# Patient Record
Sex: Female | Born: 1970 | Race: Black or African American | Hispanic: No | Marital: Married | Smoking: Never smoker
Health system: Southern US, Community
[De-identification: ages and names within clinical notes are randomized; demographics above are authoritative.]

## PROBLEM LIST (undated history)

## (undated) DIAGNOSIS — H9311 Tinnitus, right ear: Secondary | ICD-10-CM

## (undated) HISTORY — DX: Tinnitus, right ear: H93.11

## (undated) HISTORY — PX: BREAST REDUCTION SURGERY: SHX8

---

## 1997-09-14 ENCOUNTER — Emergency Department (HOSPITAL_COMMUNITY): Admission: EM | Admit: 1997-09-14 | Discharge: 1997-09-14 | Payer: Self-pay | Admitting: Emergency Medicine

## 1998-01-26 ENCOUNTER — Encounter: Admission: RE | Admit: 1998-01-26 | Discharge: 1998-01-26 | Payer: Self-pay | Admitting: *Deleted

## 1998-07-12 ENCOUNTER — Encounter: Payer: Self-pay | Admitting: Emergency Medicine

## 1998-07-12 ENCOUNTER — Emergency Department (HOSPITAL_COMMUNITY): Admission: EM | Admit: 1998-07-12 | Discharge: 1998-07-12 | Payer: Self-pay | Admitting: Emergency Medicine

## 1998-07-30 ENCOUNTER — Encounter: Payer: Self-pay | Admitting: Emergency Medicine

## 1998-07-30 ENCOUNTER — Emergency Department (HOSPITAL_COMMUNITY): Admission: EM | Admit: 1998-07-30 | Discharge: 1998-07-30 | Payer: Self-pay | Admitting: Emergency Medicine

## 1998-09-04 ENCOUNTER — Encounter: Admission: RE | Admit: 1998-09-04 | Discharge: 1998-09-04 | Payer: Self-pay | Admitting: Family Medicine

## 1999-08-07 ENCOUNTER — Inpatient Hospital Stay (HOSPITAL_COMMUNITY): Admission: AD | Admit: 1999-08-07 | Discharge: 1999-08-07 | Payer: Self-pay | Admitting: *Deleted

## 2000-04-15 ENCOUNTER — Encounter: Payer: Self-pay | Admitting: *Deleted

## 2000-04-15 ENCOUNTER — Ambulatory Visit (HOSPITAL_COMMUNITY): Admission: RE | Admit: 2000-04-15 | Discharge: 2000-04-15 | Payer: Self-pay | Admitting: *Deleted

## 2000-04-16 ENCOUNTER — Encounter (INDEPENDENT_AMBULATORY_CARE_PROVIDER_SITE_OTHER): Payer: Self-pay | Admitting: Specialist

## 2000-04-16 ENCOUNTER — Ambulatory Visit (HOSPITAL_COMMUNITY): Admission: RE | Admit: 2000-04-16 | Discharge: 2000-04-16 | Payer: Self-pay | Admitting: *Deleted

## 2000-12-31 ENCOUNTER — Other Ambulatory Visit: Admission: RE | Admit: 2000-12-31 | Discharge: 2000-12-31 | Payer: Self-pay | Admitting: Obstetrics and Gynecology

## 2001-05-20 ENCOUNTER — Emergency Department (HOSPITAL_COMMUNITY): Admission: EM | Admit: 2001-05-20 | Discharge: 2001-05-20 | Payer: Self-pay

## 2001-11-29 ENCOUNTER — Encounter (INDEPENDENT_AMBULATORY_CARE_PROVIDER_SITE_OTHER): Payer: Self-pay | Admitting: *Deleted

## 2001-11-29 ENCOUNTER — Ambulatory Visit (HOSPITAL_BASED_OUTPATIENT_CLINIC_OR_DEPARTMENT_OTHER): Admission: RE | Admit: 2001-11-29 | Discharge: 2001-11-29 | Payer: Self-pay | Admitting: Specialist

## 2002-01-03 ENCOUNTER — Other Ambulatory Visit: Admission: RE | Admit: 2002-01-03 | Discharge: 2002-01-03 | Payer: Self-pay | Admitting: Obstetrics and Gynecology

## 2002-01-30 ENCOUNTER — Emergency Department (HOSPITAL_COMMUNITY): Admission: EM | Admit: 2002-01-30 | Discharge: 2002-01-30 | Payer: Self-pay | Admitting: Emergency Medicine

## 2002-09-29 ENCOUNTER — Other Ambulatory Visit: Admission: RE | Admit: 2002-09-29 | Discharge: 2002-09-29 | Payer: Self-pay | Admitting: Obstetrics and Gynecology

## 2002-10-26 ENCOUNTER — Emergency Department (HOSPITAL_COMMUNITY): Admission: EM | Admit: 2002-10-26 | Discharge: 2002-10-27 | Payer: Self-pay | Admitting: Emergency Medicine

## 2004-02-24 ENCOUNTER — Emergency Department (HOSPITAL_COMMUNITY): Admission: EM | Admit: 2004-02-24 | Discharge: 2004-02-24 | Payer: Self-pay | Admitting: Emergency Medicine

## 2004-04-07 ENCOUNTER — Inpatient Hospital Stay (HOSPITAL_COMMUNITY): Admission: AD | Admit: 2004-04-07 | Discharge: 2004-04-07 | Payer: Self-pay | Admitting: Obstetrics and Gynecology

## 2004-08-06 ENCOUNTER — Ambulatory Visit: Payer: Self-pay | Admitting: Internal Medicine

## 2004-08-07 ENCOUNTER — Ambulatory Visit (HOSPITAL_COMMUNITY): Admission: RE | Admit: 2004-08-07 | Discharge: 2004-08-07 | Payer: Self-pay | Admitting: Obstetrics and Gynecology

## 2004-08-13 ENCOUNTER — Ambulatory Visit: Payer: Self-pay

## 2004-10-10 ENCOUNTER — Ambulatory Visit: Payer: Self-pay | Admitting: Internal Medicine

## 2004-10-16 ENCOUNTER — Other Ambulatory Visit: Admission: RE | Admit: 2004-10-16 | Discharge: 2004-10-16 | Payer: Self-pay | Admitting: Internal Medicine

## 2004-10-16 ENCOUNTER — Ambulatory Visit: Payer: Self-pay | Admitting: Internal Medicine

## 2005-05-30 ENCOUNTER — Ambulatory Visit: Payer: Self-pay | Admitting: Family Medicine

## 2005-06-03 ENCOUNTER — Ambulatory Visit: Payer: Self-pay | Admitting: Internal Medicine

## 2005-06-13 ENCOUNTER — Ambulatory Visit: Payer: Self-pay | Admitting: Internal Medicine

## 2005-08-26 ENCOUNTER — Ambulatory Visit: Payer: Self-pay | Admitting: Internal Medicine

## 2005-08-29 ENCOUNTER — Emergency Department (HOSPITAL_COMMUNITY): Admission: EM | Admit: 2005-08-29 | Discharge: 2005-08-29 | Payer: Self-pay | Admitting: Emergency Medicine

## 2005-09-26 ENCOUNTER — Inpatient Hospital Stay (HOSPITAL_COMMUNITY): Admission: AD | Admit: 2005-09-26 | Discharge: 2005-09-26 | Payer: Self-pay | Admitting: Obstetrics and Gynecology

## 2005-11-27 ENCOUNTER — Ambulatory Visit: Payer: Self-pay | Admitting: Internal Medicine

## 2005-12-15 ENCOUNTER — Ambulatory Visit: Payer: Self-pay | Admitting: Internal Medicine

## 2006-01-28 ENCOUNTER — Ambulatory Visit: Payer: Self-pay | Admitting: Internal Medicine

## 2006-02-05 ENCOUNTER — Encounter: Admission: RE | Admit: 2006-02-05 | Discharge: 2006-02-05 | Payer: Self-pay | Admitting: Internal Medicine

## 2006-03-27 ENCOUNTER — Ambulatory Visit: Payer: Self-pay | Admitting: Internal Medicine

## 2006-04-01 ENCOUNTER — Encounter: Admission: RE | Admit: 2006-04-01 | Discharge: 2006-04-01 | Payer: Self-pay | Admitting: Internal Medicine

## 2006-05-23 ENCOUNTER — Ambulatory Visit: Payer: Self-pay | Admitting: Internal Medicine

## 2006-06-03 ENCOUNTER — Ambulatory Visit: Payer: Self-pay | Admitting: Family Medicine

## 2006-07-07 ENCOUNTER — Ambulatory Visit: Payer: Self-pay | Admitting: Internal Medicine

## 2006-09-03 ENCOUNTER — Emergency Department (HOSPITAL_COMMUNITY): Admission: EM | Admit: 2006-09-03 | Discharge: 2006-09-04 | Payer: Self-pay | Admitting: *Deleted

## 2006-09-05 ENCOUNTER — Ambulatory Visit: Payer: Self-pay | Admitting: Internal Medicine

## 2007-01-14 ENCOUNTER — Ambulatory Visit: Payer: Self-pay | Admitting: Internal Medicine

## 2007-02-03 ENCOUNTER — Telehealth: Payer: Self-pay | Admitting: Internal Medicine

## 2007-02-05 DIAGNOSIS — J45909 Unspecified asthma, uncomplicated: Secondary | ICD-10-CM | POA: Insufficient documentation

## 2007-02-05 DIAGNOSIS — R002 Palpitations: Secondary | ICD-10-CM

## 2007-02-12 ENCOUNTER — Telehealth: Payer: Self-pay | Admitting: Internal Medicine

## 2007-02-13 ENCOUNTER — Inpatient Hospital Stay (HOSPITAL_COMMUNITY): Admission: AD | Admit: 2007-02-13 | Discharge: 2007-02-13 | Payer: Self-pay | Admitting: Obstetrics and Gynecology

## 2007-03-31 ENCOUNTER — Inpatient Hospital Stay (HOSPITAL_COMMUNITY): Admission: AD | Admit: 2007-03-31 | Discharge: 2007-03-31 | Payer: Self-pay | Admitting: Obstetrics & Gynecology

## 2007-05-21 ENCOUNTER — Inpatient Hospital Stay (HOSPITAL_COMMUNITY): Admission: RE | Admit: 2007-05-21 | Discharge: 2007-05-24 | Payer: Self-pay | Admitting: Obstetrics and Gynecology

## 2007-05-25 ENCOUNTER — Encounter (HOSPITAL_COMMUNITY): Admission: RE | Admit: 2007-05-25 | Discharge: 2007-06-20 | Payer: Self-pay | Admitting: Obstetrics and Gynecology

## 2007-06-03 HISTORY — PX: TYMPANOSTOMY TUBE PLACEMENT: SHX32

## 2007-06-04 ENCOUNTER — Inpatient Hospital Stay (HOSPITAL_COMMUNITY): Admission: AD | Admit: 2007-06-04 | Discharge: 2007-06-05 | Payer: Self-pay | Admitting: Obstetrics and Gynecology

## 2007-06-04 ENCOUNTER — Telehealth (INDEPENDENT_AMBULATORY_CARE_PROVIDER_SITE_OTHER): Payer: Self-pay | Admitting: *Deleted

## 2007-06-10 ENCOUNTER — Ambulatory Visit: Payer: Self-pay | Admitting: Internal Medicine

## 2007-06-10 DIAGNOSIS — D649 Anemia, unspecified: Secondary | ICD-10-CM

## 2007-06-10 DIAGNOSIS — R51 Headache: Secondary | ICD-10-CM

## 2007-06-10 DIAGNOSIS — R519 Headache, unspecified: Secondary | ICD-10-CM | POA: Insufficient documentation

## 2007-06-10 DIAGNOSIS — R030 Elevated blood-pressure reading, without diagnosis of hypertension: Secondary | ICD-10-CM

## 2007-06-10 DIAGNOSIS — L299 Pruritus, unspecified: Secondary | ICD-10-CM | POA: Insufficient documentation

## 2007-06-10 LAB — CONVERTED CEMR LAB: Hemoglobin: 10.9 g/dL

## 2007-06-25 ENCOUNTER — Telehealth: Payer: Self-pay | Admitting: Internal Medicine

## 2007-06-25 ENCOUNTER — Telehealth: Payer: Self-pay | Admitting: Family Medicine

## 2007-06-25 ENCOUNTER — Ambulatory Visit: Payer: Self-pay | Admitting: Internal Medicine

## 2007-06-29 ENCOUNTER — Ambulatory Visit: Admission: RE | Admit: 2007-06-29 | Discharge: 2007-06-29 | Payer: Self-pay | Admitting: Obstetrics and Gynecology

## 2007-06-29 ENCOUNTER — Ambulatory Visit: Payer: Self-pay | Admitting: Internal Medicine

## 2007-06-29 DIAGNOSIS — L089 Local infection of the skin and subcutaneous tissue, unspecified: Secondary | ICD-10-CM | POA: Insufficient documentation

## 2007-08-10 ENCOUNTER — Ambulatory Visit: Payer: Self-pay | Admitting: Internal Medicine

## 2007-08-10 ENCOUNTER — Telehealth: Payer: Self-pay | Admitting: Internal Medicine

## 2007-08-10 DIAGNOSIS — H9319 Tinnitus, unspecified ear: Secondary | ICD-10-CM | POA: Insufficient documentation

## 2007-08-21 ENCOUNTER — Encounter: Admission: RE | Admit: 2007-08-21 | Discharge: 2007-08-21 | Payer: Self-pay | Admitting: Internal Medicine

## 2007-12-16 ENCOUNTER — Telehealth: Payer: Self-pay | Admitting: Internal Medicine

## 2008-01-28 ENCOUNTER — Ambulatory Visit: Payer: Self-pay | Admitting: Internal Medicine

## 2008-01-28 DIAGNOSIS — F4320 Adjustment disorder, unspecified: Secondary | ICD-10-CM | POA: Insufficient documentation

## 2008-02-01 ENCOUNTER — Telehealth: Payer: Self-pay | Admitting: *Deleted

## 2008-02-01 LAB — CONVERTED CEMR LAB: GC Probe Amp, Urine: NEGATIVE

## 2008-03-19 ENCOUNTER — Telehealth: Payer: Self-pay | Admitting: Internal Medicine

## 2008-09-03 ENCOUNTER — Telehealth: Payer: Self-pay | Admitting: Family Medicine

## 2008-12-07 ENCOUNTER — Inpatient Hospital Stay (HOSPITAL_COMMUNITY): Admission: AD | Admit: 2008-12-07 | Discharge: 2008-12-07 | Payer: Self-pay | Admitting: Obstetrics and Gynecology

## 2008-12-07 ENCOUNTER — Inpatient Hospital Stay (HOSPITAL_COMMUNITY): Admission: RE | Admit: 2008-12-07 | Discharge: 2008-12-10 | Payer: Self-pay | Admitting: Obstetrics and Gynecology

## 2009-01-06 ENCOUNTER — Telehealth: Payer: Self-pay | Admitting: Internal Medicine

## 2010-02-05 ENCOUNTER — Ambulatory Visit: Payer: Self-pay | Admitting: Internal Medicine

## 2010-02-07 ENCOUNTER — Ambulatory Visit: Payer: Self-pay | Admitting: Internal Medicine

## 2010-02-07 ENCOUNTER — Other Ambulatory Visit: Admission: RE | Admit: 2010-02-07 | Discharge: 2010-02-07 | Payer: Self-pay | Admitting: Internal Medicine

## 2010-02-07 DIAGNOSIS — R0789 Other chest pain: Secondary | ICD-10-CM | POA: Insufficient documentation

## 2010-02-07 DIAGNOSIS — F4323 Adjustment disorder with mixed anxiety and depressed mood: Secondary | ICD-10-CM | POA: Insufficient documentation

## 2010-03-15 ENCOUNTER — Ambulatory Visit: Payer: Self-pay | Admitting: Internal Medicine

## 2010-04-09 ENCOUNTER — Ambulatory Visit: Payer: Self-pay | Admitting: Internal Medicine

## 2010-04-09 DIAGNOSIS — B009 Herpesviral infection, unspecified: Secondary | ICD-10-CM | POA: Insufficient documentation

## 2010-05-08 ENCOUNTER — Ambulatory Visit: Payer: Self-pay | Admitting: Internal Medicine

## 2010-06-23 ENCOUNTER — Encounter: Payer: Self-pay | Admitting: Internal Medicine

## 2010-06-30 LAB — CONVERTED CEMR LAB
ALT: 25 units/L (ref 0–35)
AST: 28 units/L (ref 0–37)
Alkaline Phosphatase: 46 units/L (ref 39–117)
BUN: 11 mg/dL (ref 6–23)
Bilirubin, Direct: 0.1 mg/dL (ref 0.0–0.3)
Calcium: 9.2 mg/dL (ref 8.4–10.5)
Chloride: 110 meq/L (ref 96–112)
Creatinine, Ser: 0.7 mg/dL (ref 0.4–1.2)
Eosinophils Absolute: 0.1 10*3/uL (ref 0.0–0.7)
Eosinophils Relative: 1.8 % (ref 0.0–5.0)
GFR calc non Af Amer: 112.33 mL/min (ref >60)
HCT: 37.5 % (ref 36.0–46.0)
Hemoglobin: 12.4 g/dL (ref 12.0–15.0)
LDL Cholesterol: 131 mg/dL — ABNORMAL HIGH (ref 0–99)
Lymphocytes Relative: 32.5 % (ref 12.0–46.0)
Lymphs Abs: 2.4 10*3/uL (ref 0.7–4.0)
MCV: 84.1 fL (ref 78.0–100.0)
Neutro Abs: 4.4 10*3/uL (ref 1.4–7.7)
Potassium: 4.1 meq/L (ref 3.5–5.1)
Total Protein: 7.5 g/dL (ref 6.0–8.3)
WBC: 7.3 10*3/uL (ref 4.5–10.5)

## 2010-07-04 NOTE — Assessment & Plan Note (Signed)
Summary: follow up/ssc   Vital Signs:  Patient profile:   40 year old female Menstrual status:  regular LMP:     04/09/2010 Weight:      206 pounds Pulse rate:   78 / minute BP sitting:   110 / 70  (right arm) Cuff size:   regular  Vitals Entered By: Romualdo Bolk, CMA (AAMA) (April 09, 2010 3:31 PM) CC: Follow-up visit on labs LMP (date): 04/09/2010     Enter LMP: 04/09/2010 Last PAP Result NEGATIVE FOR INTRAEPITHELIAL LESIONS OR MALIGNANCY.   History of Present Illness: Sandra Key comes in today  for follow up of new medication    prozac     since last visit  seen has been: Taking prozac   but then forgot  for 4 days and then  took 2 per day and felt  jittery .   Medicationelps some   but still getting upset.     Having sweats recently   ? if from meds.   Asthma  stable  Asks sfor refill of med for as needed hsv     Preventive Screening-Counseling & Management  Alcohol-Tobacco     Alcohol drinks/day: <1     Alcohol type: wine     Smoking Status: never  Caffeine-Diet-Exercise     Caffeine use/day: 2-4     Does Patient Exercise: no  Current Medications (verified): 1)  Proair Hfa 108 (90 Base) Mcg/act Aers (Albuterol Sulfate) .... Inhale 2 Puff As Directed Every  6 Hours If Needed For Asthma 2)  Qvar 80 Mcg/act Aers (Beclomethasone Dipropionate) .... Inhale 2 Puff Twice A Day 3)  Zyrtec Allergy 10 Mg Tabs (Cetirizine Hcl) .... Take 1 Tablet By Mouth Once A Day 4)  Prozac 10 Mg Caps (Fluoxetine Hcl) .Marland Kitchen.. 1 By Mouth Once Daily and Increase To 2 By Mouth Once Daily in  1 Week or As Directed 5)  Ativan 0.5 Mg Tabs (Lorazepam) .Marland Kitchen.. 1 By Mouth Two Times A Day As Needed Anxiety  Allergies (verified): 1)  Codeine Phosphate (Codeine Phosphate)  Past History:  Past medical, surgical, family and social histories (including risk factors) reviewed, and no changes noted (except as noted below).  Past Medical History: Reviewed history from 01/28/2008 and no  changes required. Allergic rhinitis Asthma Recurrent Pregnancy Loss para 2   Past Surgical History: Reviewed history from 01/28/2008 and no changes required. Breast Reduction C-Section   Past History:  Care Management: Gynecology: [facility]-in past  Family History: Reviewed history from 06/10/2007 and no changes required. Family History Hypertension  Social History: Reviewed history from 02/07/2010 and no changes required. Married  separated 5 months.   on son with autism.   needs  hh of 4     pet dog     mom helps with child care  Never Smoked Alcohol use-no Drug use-no Regular exercise-no [place] [employer].   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      Review of Systems  The patient denies anorexia, fever, weight loss, weight gain, vision loss, abdominal pain, abnormal bleeding, enlarged lymph nodes, and angioedema.    Physical Exam  General:  Well-developed,well-nourished,in no acute distress; alert,appropriate and cooperative throughout examination Head:  normocephalic and atraumatic.   Psych:  Oriented X3, memory intact for recent and remote, good eye contact, and not anxious appearing.     Impression & Recommendations:  Problem # 1:  ADJ DISORDER WITH MIXED ANXIETY & DEPRESSED MOOD (ICD-309.28) ?  if improved a bit  ... hasnt been able to take meds continuously  and will increase when able and then follow up and call in meantime   Problem # 2:  HERPES SIMPLEX INFECTION (ICD-054.9) med options discussed  Complete Medication List: 1)  Proair Hfa 108 (90 Base) Mcg/act Aers (Albuterol sulfate) .... Inhale 2 puff as directed every  6 hours if needed for asthma 2)  Qvar 80 Mcg/act Aers (Beclomethasone dipropionate) .... Inhale 2 puff twice a day 3)  Zyrtec Allergy 10 Mg Tabs (Cetirizine hcl) .... Take 1 tablet by mouth once a day 4)  Prozac 10 Mg Caps (Fluoxetine hcl) .Marland Kitchen.. 1 by mouth once daily and increase to 2 by mouth once daily in  1  week or as directed 5)  Ativan 0.5 Mg Tabs (Lorazepam) .Marland Kitchen.. 1 by mouth two times a day as needed anxiety 6)  Valacyclovir Hcl 1 Gm Tabs (Valacyclovir hcl) .Marland Kitchen.. 1 by mouth two times a day 5 days or  as directed  Patient Instructions: 1)  your labs are good. 2)  Try to increase prozac  to 2 by mouth once daily in about a week or so. 3)  if you have a nuisance side effect call  and we can increase slower. 4)  REC   return office visit after a month of 20 mg per day  of med . Prescriptions: VALACYCLOVIR HCL 1 GM TABS (VALACYCLOVIR HCL) 1 by mouth two times a day 5 days or  as directed  #30 x 3   Entered and Authorized by:   Madelin Headings MD   Signed by:   Madelin Headings MD on 04/09/2010   Method used:   [facility]        [facility] [id number]       [address]       [place], [place]  [address]       Ph: [phone] or [phone]       Fax: [phone]   RxID:   [id number]    Orders Added: 1)  Est. Patient Level III [34742]

## 2010-07-04 NOTE — Assessment & Plan Note (Signed)
Summary: cpx/ssc   Vital Signs:  Patient profile:   40 year old female Menstrual status:  regular LMP:     01/14/2010 Height:      65.5 inches Weight:      197 pounds BMI:     32.40 Pulse rate:   72 / minute BP sitting:   120 / 80  (left arm) Cuff size:   regular  Vitals Entered By: Romualdo Bolk, CMA (AAMA) (February 07, 2010 11:09 AM) CC: CPX with pap, pt wants to discuss going back on lexapro and she is having chest pains that has been going on for 2-3 weeks with heart palps that may be due to caffine or anxiety. Pt is also having stiffiness and achiness inside her neck that was going on in the past. LMP (date): 01/14/2010     Menstrual Status regular Enter LMP: 01/14/2010 Last PAP Result normal   History of Present Illness: Sandra Key comes in today  for  a work in check up as requested  .  LAst ov was 2 yeasr ago.   Since that time she had lost her insurance and for the last 5 months  separated.   She is here with one of her young children .  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  has a form to complete.    However she brings up a number of other problems that are active:  Stress  and moody   and some chest pain    .  mah have been on lexapro in past unsure how this helped . requesting more med . Asthma     once a day inhaler    on no controller   inhaler  . used her childrens med as they are not needeing theirs.    CP as above off and on without assoicated sx    .  Doesnt think above cp is from asthma.       Preventive Care Screening  Pap Smear:    Date:  11/30/2008    Results:  normal   Prior Values:    Last Tetanus Booster:  Tdap (06/25/2007)   Preventive Screening-Counseling & Management  Alcohol-Tobacco     Alcohol drinks/day: <1     Alcohol type: wine     Smoking Status: never  Caffeine-Diet-Exercise     Caffeine use/day: 2-4     Does Patient Exercise: no  Safety-Violence-Falls     Seat Belt Use: yes     Smoke Detectors: yes  Current  Medications (verified): 1)  Flonase 50 Mcg/act Susp (Fluticasone Propionate) .... Spray 2 Spray Into Both Nostrils Once A Day 2)  Proair Hfa 108 (90 Base) Mcg/act Aers (Albuterol Sulfate) .... Inhale 2 Puff As Directed Every Four Hours 3)  Qvar 80 Mcg/act Aers (Beclomethasone Dipropionate) .... Inhale 2 Puff Twice A Day 4)  Zyrtec Allergy 10 Mg Tabs (Cetirizine Hcl) .... Take 1 Tablet By Mouth Once A Day 5)  Lexapro 10 Mg Tabs (Escitalopram Oxalate) .... 1/2  By Mouth Once Daily and Increase To 1 By Mouth Once Daily  Allergies (verified): 1)  Codeine Phosphate (Codeine Phosphate)  Past History:  Past medical, surgical, family and social histories (including risk factors) reviewed, and no changes noted (except as noted below).  Past Medical History: Reviewed history from 01/28/2008 and no changes required. Allergic rhinitis Asthma Recurrent Pregnancy Loss para 2   Past Surgical History: Reviewed history from 01/28/2008 and no changes required. Breast Reduction C-Section   Past  History:  Care Management: Gynecology: [facility]-in past  Family History: Reviewed history from 06/10/2007 and no changes required. Family History Hypertension  Social History: Reviewed history from 01/28/2008 and no changes required. Married  separated 5 months.   on son with autism.   needs  hh of 4     pet dog     mom helps with child care  Never Smoked Alcohol use-no Drug use-no Regular exercise-no [place] [employer].   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     Caffeine use/day:  2-4 Seat Belt Use:  yes  Review of Systems  The patient denies anorexia, fever, weight loss, weight gain, vision loss, decreased hearing, hoarseness, syncope, peripheral edema, abdominal pain, melena, hematochezia, severe indigestion/heartburn, hematuria, muscle weakness, transient blindness, difficulty walking, unusual weight change, abnormal bleeding, enlarged lymph nodes, angioedema, and  breast masses.         neck stiffness and pain .     no weakness or injury  resst of ros neg or as per hpi  Physical Exam  General:  Well-developed,well-nourished,in no acute distress; alert,appropriate and cooperative throughout examination Head:  normocephalic, atraumatic, no abnormalities observed, and no abnormalities palpated.   Eyes:  PERRL, EOMs full, conjunctiva clear  Ears:  R ear normal, L ear normal, and no external deformities.   Nose:  no external deformity, no external erythema, and no nasal discharge.   Mouth:  pharynx pink and moist.  minimally hoarse  Neck:  no change in goiter non tendern Breasts:  No mass, nodules, thickening, tenderness, bulging, retraction, inflamation, nipple discharge or skin changes noted.   Lungs:  Normal respiratory effort, chest expands symmetrically. Lungs are clear to auscultation, no crackles or wheezes.no dullness.   Heart:  Normal rate and regular rhythm. S1 and S2 normal without gallop, murmur, click, rub or other extra sounds.no lifts.   Abdomen:  Bowel sounds positive,abdomen soft and non-tender without masses, organomegaly or  noted.  poss small 2 fb henia  non tendern and no swelling Genitalia:  Pelvic Exam:        External: normal female genitalia without lesions or masses        Vagina: normal without lesions or masses        Cervix: normal without lesions or masses        Adnexa: normal bimanual exam without masses or fullness        Uterus: normal by palpation        Pap smear: performed Msk:  no joint swelling, no joint warmth, and no redness over joints.   Pulses:  pulses intact without delay   Extremities:  no clubbing cyanosis or edema  Neurologic:  alert & oriented X3, cranial nerves II-XII intact, strength normal in all extremities, and gait normal.   Skin:  turgor normal, color normal, no ecchymoses, and no petechiae.   Cervical Nodes:  No lymphadenopathy noted Axillary Nodes:  No palpable lymphadenopathy Inguinal Nodes:   No significant adenopathy Psych:  Oriented X3, good eye contact, not anxious appearing, and not depressed appearing.   EKG   nsr with ns t wave changes   Impression & Recommendations:  Problem # 1:  HEALTH MAINTENANCE EXAM (ICD-V70.0) counseled healthy   lifestyle       form signed for  fob .  ppd is only 44 hours old and mininmal redness induration  flu  shot   today.  Orders: TLB-TSH (Thyroid Stimulating Hormone) (84443-TSH) TLB-Hepatic/Liver Function Pnl (80076-HEPATIC) TLB-CBC  Platelet - w/Differential (85025-CBCD) TLB-BMP (Basic Metabolic Panel-BMET) (80048-METABOL) TLB-Lipid Panel (80061-LIPID) T-HIV Antibody  (Reflex) [id number]) T-RPR (Syphilis) (84696-29528) Specimen Handling (41324) TLB-T4 (Thyrox), Free [phone])  Problem # 2:  ROUTINE GYNECOLOGICAL EXAM (ICD-V72.31)  pap done      Orders: Pap Smear, Thin Prep ( Collection of) (G6440)  Problem # 3:  ASTHMA (ICD-493.90) Assessment: New daily   b agonist use  needs to be on controller med as discussed  counseled aabout managment  Her updated medication list for this problem includes:    Proair Hfa 108 (90 Base) Mcg/act Aers (Albuterol sulfate) ..... Inhale 2 puff as directed every  6 hours if needed for asthma    Qvar 80 Mcg/act Aers (Beclomethasone dipropionate) ..... Inhale 2 puff twice a day  Problem # 4:  CHEST PAIN, ATYPICAL (ICD-786.59) Assessment: New  poss stress related  she says not from asthma   ekg no acute changes... ns st change   Orders: EKG w/ Interpretation (93000)  Problem # 5:  ADJ DISORDER WITH MIXED ANXIETY & DEPRESSED MOOD (ICD-309.28) Assessment: New disc    options   and need for follow up   Problem # 6:  poss unbi  hernia small  may have a small umbi hernia    Complete Medication List: 1)  Proair Hfa 108 (90 Base) Mcg/act Aers (Albuterol sulfate) .... Inhale 2 puff as directed every  6 hours if needed for asthma 2)  Qvar 80 Mcg/act Aers (Beclomethasone dipropionate) .... Inhale  2 puff twice a day 3)  Zyrtec Allergy 10 Mg Tabs (Cetirizine hcl) .... Take 1 tablet by mouth once a day 4)  Prozac 10 Mg Caps (Fluoxetine hcl) .Marland Kitchen.. 1 by mouth once daily and increase to 2 by mouth once daily in  1 week or as directed 5)  Ativan 0.5 Mg Tabs (Lorazepam) .Marland Kitchen.. 1 by mouth two times a day as needed anxiety  Other Orders: Admin 1st Vaccine (34742) Flu Vaccine 76yrs + (59563)  Patient Instructions: 1)  call about  skin test  his pm or  tomorrow am and leavea amessage . 2)  begin asthma controller meds. 3)  Can begin control for anxiety /   depression every day. 4)  can use the ativan as needed for  anxiety in the meantime . 5)  return office visit in 3-4 weeks end of day ok    4 15  or 430 30  or   teacher work day time. Prescriptions: ATIVAN 0.5 MG TABS (LORAZEPAM) 1 by mouth two times a day as needed anxiety  #20 x 0   Entered and Authorized by:   Madelin Headings MD   Signed by:   Madelin Headings MD on 02/07/2010   Method used:   Print then Give to Patient   RxID:   [id number] PROZAC 10 MG CAPS (FLUOXETINE HCL) 1 by mouth once daily and increase to 2 by mouth once daily in  1 week or as directed  #60 x 1   Entered and Authorized by:   Madelin Headings MD   Signed by:   Madelin Headings MD on 02/07/2010   Method used:   [facility]        [facility]       [address]       [place], [place]  [address]       Ph: [phone] or [phone]       Fax: [phone]   RxID:  [id number] QVAR 80 MCG/ACT AERS (BECLOMETHASONE DIPROPIONATE) Inhale 2 puff twice a day  #1 x 12   Entered and Authorized by:   Madelin Headings MD   Signed by:   Madelin Headings MD on 02/07/2010   Method used:   [facility]        [facility] [id number]       [address]       [place], [place]  [address]       Ph: [phone] or [phone]       Fax: [phone]   RxID:   [id number] PROAIR HFA 108 (90 BASE) MCG/ACT AERS (ALBUTEROL  SULFATE) Inhale 2 puff as directed every  6 hours if needed for asthma  #1 x 1   Entered and Authorized by:   Madelin Headings MD   Signed by:   Madelin Headings MD on 02/07/2010   Method used:   [facility]        [facility]       [address]       [place], [place]  [address]       Ph: [phone] or [phone]       Fax: [phone]   RxID:   [id number]   Flu Vaccine Consent Questions     Do you have a history of severe allergic reactions to this vaccine? no    Any prior history of allergic reactions to egg and/or gelatin? no    Do you have a sensitivity to the preservative Thimersol? no    Do you have a past history of Guillan-Barre Syndrome? no    Do you currently have an acute febrile illness? no    Have you ever had a severe reaction to latex? no    Vaccine information given and explained to patient? yes    Are you currently pregnant? no    Lot Number:[id number]   Exp Date:11/30/2010   Site Given  Left Deltoid IMu Romualdo Bolk, CMA (AAMA)  February 07, 2010 11:18 AM 25 minutes spent addressing  management of her chest pain , anxiety depression and her asthma   .Marland Kitchen

## 2010-07-04 NOTE — Assessment & Plan Note (Signed)
Summary: ppd only pt to come 4:30pm/ssc   Nurse Visit   Allergies: 1)  Codeine Phosphate (Codeine Phosphate)  Immunizations Administered:  PPD Skin Test:    Vaccine Type: PPD    Site: left forearm    Mfr: Sanofi Pasteur    Dose: 0.1 ml    Route: ID    Given by: Romualdo Bolk, CMA (AAMA)    Exp. Date: 04/04/2011    Lot #: [id number]  Orders Added: 1)  TB Skin Test [86580] 2)  Admin 1st Vaccine [phone]

## 2010-09-09 LAB — CBC
HCT: 38.3 % (ref 36.0–46.0)
MCHC: 34.3 g/dL (ref 30.0–36.0)
Platelets: 148 10*3/uL — ABNORMAL LOW (ref 150–400)
Platelets: 185 10*3/uL (ref 150–400)
RBC: 3.35 MIL/uL — ABNORMAL LOW (ref 3.87–5.11)
RBC: 4.36 MIL/uL (ref 3.87–5.11)
WBC: 10.6 10*3/uL — ABNORMAL HIGH (ref 4.0–10.5)

## 2010-09-09 LAB — TYPE AND SCREEN: ABO/RH(D): O POS

## 2010-09-25 ENCOUNTER — Telehealth: Payer: Self-pay | Admitting: *Deleted

## 2010-09-25 DIAGNOSIS — K429 Umbilical hernia without obstruction or gangrene: Secondary | ICD-10-CM

## 2010-09-25 DIAGNOSIS — J329 Chronic sinusitis, unspecified: Secondary | ICD-10-CM

## 2010-09-25 NOTE — Telephone Encounter (Signed)
New Name is Sandra Key Pt would like a referral to ENT for sinus and stopped up ears.  Also, she is would like to be referred for painful umbilical hernia.

## 2010-09-25 NOTE — Telephone Encounter (Signed)
Per Dr. Fabian Sharp- okay to do referral for umbilical hernia and okay to do ENT if recurrent.  Spoke with pt and she is having recurrent sinus problems but is unsure if they are infections.

## 2010-10-01 ENCOUNTER — Encounter: Payer: Self-pay | Admitting: Speech Pathology

## 2010-10-03 ENCOUNTER — Encounter: Payer: Self-pay | Admitting: Internal Medicine

## 2010-10-04 ENCOUNTER — Encounter: Payer: Self-pay | Admitting: Internal Medicine

## 2010-10-04 ENCOUNTER — Ambulatory Visit (INDEPENDENT_AMBULATORY_CARE_PROVIDER_SITE_OTHER): Payer: BC Managed Care – PPO | Admitting: Internal Medicine

## 2010-10-04 VITALS — BP 110/70 | HR 72 | Temp 99.0°F | Wt 198.0 lb

## 2010-10-04 DIAGNOSIS — J309 Allergic rhinitis, unspecified: Secondary | ICD-10-CM | POA: Insufficient documentation

## 2010-10-04 DIAGNOSIS — F4320 Adjustment disorder, unspecified: Secondary | ICD-10-CM

## 2010-10-04 DIAGNOSIS — J019 Acute sinusitis, unspecified: Secondary | ICD-10-CM

## 2010-10-04 DIAGNOSIS — H6980 Other specified disorders of Eustachian tube, unspecified ear: Secondary | ICD-10-CM

## 2010-10-04 MED ORDER — AMOXICILLIN-POT CLAVULANATE 875-125 MG PO TABS: 1.0000 | ORAL_TABLET | Freq: Two times a day (BID) | ORAL | Status: AC

## 2010-10-04 MED ORDER — ESCITALOPRAM OXALATE 10 MG PO TABS: ORAL_TABLET | ORAL | Status: DC

## 2010-10-04 NOTE — Patient Instructions (Signed)
Begin antibiotic  for sinus infection  Expect improvement in the next 3-5 days. Begin lexapro for anxiety suppression  . Take daily as directed. Rec ROV or call in 1 months  Consider counseling as dicussed

## 2010-10-04 NOTE — Progress Notes (Signed)
  Subjective:    Patient ID: Sandra Key, female    DOB: [birth year], 40 y.o.   MRN: [id number]  HPI  Patient comes in as an acute appt for above problem  .  She had requested an ENT referral was unaware that we had made an appointment  with Dr Pollyann Kennedy today  so came here for her appointment here . Coughing up  phegm and ear pressure and throat sore  Taking Mucinex d  With some help.   But continuing .  Onset 2 weeks ago.    And sneezing  and  Now lingering.    righ t ear ringing like before when she had had a tube put in a few years ago but there is no draining or severe pain .   Feels pressure like wont pop.   Also hasn't had paranasal face pain and thick green nasal drainage. No chest pain or shortness of breath.   Stress anxiety:  She had tried the Prozac last year and had gone up to 20 mg a day for least a few weeks to a month and she did not notice any improvement in her anxiety so stopped it.  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  She states she usually doesn't get enough sleep.  Review of Systems Negative for chest pain shortness of breath depression. She has a history of being allergic to many environmental allergens and was recommended to take allergy shots in the remote past. She has used a nasal steroid in the past but doesn't like them because they stay. She is taking daily over-the-counter antihistamines. Rest of ROS as per history of present illness    Objective:   Physical Exam Well-developed well-nourished in no acute distress with some obvious head congestion. HEENT: Normocephalic ;atraumatic , Eyes;  PERRL, EOMs  Full, lids and conjunctiva clear,,Ears: no deformities, canals nl, TM landmarks normal in left ear    Somewhat distorted splayed in right ear but not  really dull or thickened , Nose: no deformity increased turbinate no discharge seen ,redness 2+  Mouth : OP clear without lesion or edema . Posterior pharyngeal redness face mildly tender paranasal area Neck No masses  Chest:  Clear to A&P without wheezes rales or rhonchi CV:  S1-S2 no gallops or murmurs peripheral perfusion is normal No clubbing cyanosis or edema  Psych:  orieted not depressed cognition.d speech normal.      Assessment & Plan:  Probable sinusitis with prolonged upper respiratory congestion and worsening with right eustachian tube dysfunction. Past history of PE tube.  Underlying allergic rhinitis partly treated  still may want to consider inhaled nasal cortisone of a different type may not cause the side effect.  Reactive anxiety:   Very hectic stretched schedule with some sleep deprivation because of this. Counseled. About strategies. No help with fluoxetine in the past. Agree with counseling as an option. Handout given. Prescription for Lexapro 10 mg half once a day to increase to one a day and followup in a month or at least call. Expectant management

## 2010-10-15 NOTE — Op Note (Signed)
Sandra Key, STANBACK               ACCOUNT NO.:  [id number]   MEDICAL RECORD NO.:  [id number]          PATIENT TYPE:  INP   LOCATION:  9147                          FACILITY:  [facility]   PHYSICIAN:  Duke Salvia. Marcelle Overlie, M.D.DATE OF BIRTH:  08-Aug-1970   DATE OF PROCEDURE:  12/07/2008  DATE OF DISCHARGE:  12/07/2008                               OPERATIVE REPORT   PREOPERATIVE DIAGNOSES:  Term intrauterine pregnancy, previous cesarean  section, request sterilization.   POSTOPERATIVE DIAGNOSES:  Term intrauterine pregnancy, previous cesarean  section, request sterilization.   PROCEDURE:  Repeat low transverse cesarean section, Filshie clip tubal  ligation.   SURGEON:  Duke Salvia. Marcelle Overlie, MD   ANESTHESIA:  Spinal.   COMPLICATIONS:  None.   DRAINS:  Foley catheter.   COMPLICATIONS:  None.   BLOOD LOSS:  700 mL.   PROCEDURE AND FINDINGS:  The patient was taken to the operating room.  After an adequate level of spinal aesthetic was obtained, with the  patient in left tilt position.  The abdomen was prepped and draped in  usual manner for sterile abdominal procedures.  A Foley catheter was  positioned, draining clear urine.  Transverse incision was made excising  the old scar.  This was carried down to the fascia, which was incised  and extended transversely.  Rectus muscle divided in the midline.  Peritoneum entered superiorly without incident and extended in a  vertical manner.  The bladder blade was positioned.  Transverse incision  made in the lower segment.  A vertex was noted to be presenting prior to  incision.  This was extended with blunt dissection.  Clear fluid noted.  The VE was then used to facilitate delivery of a 9 pounds 7 ounces female,  Apgars 9 and 9.  The infant was suctioned, cord clamped, and passed to  pediatric team for further care.  The placenta was then delivered,  manually intact, uterus exteriorized, cavity wiped, clean with  laparotomy pack.  Closure  obtained with a 0 chromic locked suture  followed by an imbricating layer of 0 chromic.  This was hemostatic.  Tubes and ovaries were normal.  A Filshie clip was applied at a right  angle to tube, 3 cm from the cornu on each side completely engulfing the  tube with excellent application.  Prior to closure, sponge, needle, and  instrument counts reported as correct x2.  Peritoneum closed with a  running 3-0 Vicryl suture.  Rectus muscles reapproximated with 3-0  Vicryl interrupted sutures, 0 PDS was then used from  laterally to midline on either side to close the fascia.  The  subcutaneous tissue was undermined and made hemostatic with the Bovie.  This was irrigated and again noted to be hemostatic.  A 4-0 Monocryl  subcuticular used for skin closure with a pressure dressing.  She  tolerated this well, went to recovery room in good condition.      Richard M. Marcelle Overlie, M.D.  Electronically Signed     [facility]MED[facility]  D:  12/07/2008  T:  12/08/2008  Job:  [id number]

## 2010-10-15 NOTE — Op Note (Signed)
NAME:  Sandra Key, COLLIGNON               ACCOUNT NO.:  [id number]   MEDICAL RECORD NO.:  [id number]          PATIENT TYPE:  INP   LOCATION:  9143                          FACILITY:  [facility]   PHYSICIAN:  Dineen Kid. Rana Snare, M.D.    DATE OF BIRTH:  [birth year]   DATE OF PROCEDURE:  05/21/2007  DATE OF DISCHARGE:                               OPERATIVE REPORT   PREOPERATIVE DIAGNOSIS:  Intrauterine pregnancy at 39 weeks, previous  cesarean section, desires repeat.   POSTOPERATIVE DIAGNOSIS:  Intrauterine pregnancy at 39 weeks, previous  cesarean section, desires repeat.   PROCEDURE:  Repeat low segment transverse cesarean section.   SURGEON:  Dineen Kid. Rana Snare, M.D.   ANESTHESIA:  Spinal.   INDICATIONS:  [name] is a 40 year old G5, P1, A3, previous cesarean  section for cephalopelvic disproportion.  She desires repeat.  Pregnancy  has been complicated by advanced maternal age, decline in amniocentesis.  The risks and benefits of the procedure were discussed at length.  Informed consent was obtained.   FINDINGS AT TIME OF SURGERY:  Viable female infant, Apgars 9 and 9, pH  arterial 7.31, weight is 8 pounds 1 ounce.   DESCRIPTION OF PROCEDURE:  After adequate analgesia the patient was  placed in the supine position with left lateral tilt.  She was sterilely  prepped and draped.  The bladder was sterilely drained with a Foley  catheter.  A Pfannenstiel skin incision was made two fingerbreadths  above the pubic symphysis and taken down sharply to the fascia which was  incised transversely and extended superiorly and inferiorly to the  rectus muscle which was separated sharply in the midline.  Peritoneum  was entered sharply.  Bladder flap created and placed behind the bladder  blade.  A low segment myotomy incision was made down to the infant's  vertex and extended laterally with the operator's fingertips.  The  infant's vertex was delivered atraumatically.  The nares and pharynx  suctioned.  The  infant delivered, cord clamped, cut and handed to  pediatrician for resuscitation.  Cord blood was obtained.  Placenta  extracted manually.  The uterus was exteriorized, wiped clean with a dry  lap.  The myotomy incision was closed in two layers, first being a  running locking layer, the second being an imbricating layer of zero  Monocryl suture.  The uterus was placed back in the peritoneal cavity.  After copious amount of irrigation adequate hemostasis was assured.  The  peritoneum was closed with zero Monocryl.  Rectus muscles were plicated  in the midline.  Irrigation applied and after adequate hemostasis the  fascia was closed with a zero PDS in a running fashion.  Irrigation was  applied and after adequate hemostasis skin staples and Steri-Strips were  applied.  The patient tolerated the procedure well, was stable on  transfer to recovery.  Sponge and instrument count was normal x3.  The  patient received 1 gram of cefotetan preoperatively.      Dineen Kid Rana Snare, M.D.  Electronically Signed     DCL/MEDQ  D:  05/21/2007  T:  05/21/2007  Job:  [id number]

## 2010-10-15 NOTE — Discharge Summary (Signed)
NAME:  Sandra Key, Sandra Key               ACCOUNT NO.:  [id number]   MEDICAL RECORD NO.:  [id number]          PATIENT TYPE:  INP   LOCATION:  9143                          [facility]ILITY[facility]  [facility]   PHYSICIAN:  Guy Sandifer. Henderson Cloud, M.D. DATE OF BIRTH:  1971-02-12   DATE OF ADMISSION:  05/21/2007  DATE OF DISCHARGE:  05/24/2007                               DISCHARGE SUMMARY   ADMISSION DIAGNOSES:  1. intrauterine pregnancy at 39 weeks.  2. Previous cesarean section, for repeat.   DISCHARGE DIAGNOSES:  1. intrauterine pregnancy at 39 weeks.  2. Previous cesarean section, for repeat.   PROCEDURE:  On May 21, 2007, repeat low transverse cesarean  section.   REASON FOR ADMISSION:  This patient is a 40 year old black female, G5,  P1,  with a previous cesarean section who desires repeat.   HOSPITAL COURSE:  The patient undergoes the above procedure productive  of a viable female infant, Apgars of 9/9 at 1 and 5 minutes, respectively,  and a birth weight 8 pounds 1 ounce.  Postoperatively, she does well.  Vital signs remain stable, and she remains afebrile.  She has good  resumption of bowel activity and is able to void and ambulate.  Hemoglobin is 10.4 on May 22, 2007.   CONDITION ON DISCHARGE:  Good.   DIET:  Regular as tolerated.   ACTIVITY:  No lifting, no operation of automobiles no vaginal entry.  She is to call the office for problems including but not limited to  temperature of 101 degrees, persistent nausea and vomiting or increasing  pain.   MEDICATIONS:  1. Percocet 5/225 mg, #30, one to two p.o. q.6 h p.r.n.  2. Ibuprofen 600 mg q.6 h p.r.n.  3. Prenatal vitamin daily.   FOLLOWUP:  In the office in 2 weeks.      Guy Sandifer Henderson Cloud, M.D.  Electronically Signed    JET/MEDQ  D:  05/24/2007  T:  05/24/2007  Job:  [id number]

## 2010-10-15 NOTE — H&P (Signed)
NAME:  ALLE, DIFABIO               ACCOUNT NO.:  [id number]   MEDICAL RECORD NO.:  [id number]          PATIENT TYPE:  INP   LOCATION:  9199                          [facility]   PHYSICIAN:  Dineen Kid. Rana Snare, M.D.    DATE OF BIRTH:  [birth year]   DATE OF ADMISSION:  05/21/2007  DATE OF DISCHARGE:                              HISTORY & PHYSICAL   HISTORY OF PRESENT ILLNESS:  Ms. Sandra Key is a 40 year old G5 P1 A3 at [redacted]  weeks gestational age who presents for repeat cesarean section.  Her  pregnancy was complicated by advanced maternal age, where she declined  amniocentesis, previous cesarean section for cephalopelvic  disproportion.  She desires repeat cesarean section.  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  She is GBS positive.  Her EDC is  May 27, 2007.   PAST MEDICAL HISTORY:  History of ectopic.   PAST SURGICAL HISTORY:  1. D&C.  2. History of cesarean section.  3. Bilateral breast reduction.  4. Laparoscopy.   PAST OB HISTORY:  She has had one ectopic and two miscarriages, and a  term delivery by cesarean section.   MEDICATIONS:  Prenatal vitamins.   ALLERGIES:  She reports HYDROCODONE makes her nauseated.   PHYSICAL EXAMINATION:  VITAL SIGNS:  Her blood pressure is 100/70.  HEART:  Regular rate and rhythm.  LUNGS:  Clear to auscultation bilaterally.  ABDOMEN:  Gravid, nontender.  PELVIC:  Cervix was closed, thick, and high.   IMPRESSION AND PLAN:  Intrauterine pregnancy at 39 weeks.  Previous  cesarean section.  Desires repeat.   PLAN:  Low segment transverse cesarean section.  Risks and benefits were  discussed.  Informed consent was obtained.      Dineen Kid Rana Snare, M.D.  Electronically Signed     DCL/MEDQ  D:  05/21/2007  T:  05/21/2007  Job:  [id number]

## 2010-10-15 NOTE — Discharge Summary (Signed)
Sandra Key, Sandra Key               ACCOUNT NO.:  [id number]   MEDICAL RECORD NO.:  [id number]          PATIENT TYPE:  INP   LOCATION:  9147                          [facility]   PHYSICIAN:  Duke Salvia. Sandra Key, M.D.DATE OF BIRTH:  03-03-1971   DATE OF ADMISSION:  12/07/2008  DATE OF DISCHARGE:  12/10/2008                               DISCHARGE SUMMARY   ADMITTING DIAGNOSES:  1. Intrauterine pregnancy at term.  2. Previous cesarean section, desires repeat.  3. Multiparity, desires permanent sterilization.   DISCHARGE DIAGNOSES:  1. Status post low transverse cesarean section.  2. Viable female infant.  3. Permanent sterilization.   PROCEDURE:  1. Repeat low transverse cesarean section.  2. Bilateral tubal ligation.   REASON FOR ADMISSION:  Please see dictated H and P.   HOSPITAL COURSE:  The patient is a 40 year old, gravida 6, para 2-0-3-2,  that was admit to [facility] at term for scheduled  cesarean section.  The patient had 2 previous cesarean deliveries and  desired repeat.  Due to multiparity, the patient also requested  permanent sterilization.  On the morning of admission, the patient was  taken to operating room where spinal anesthesia was administered without  difficulty.  A low transverse incision was made with delivery of a  viable female infant, weighing 9 pounds 7 ounces with Apgars of 9 at 1  minute and 9 at 5 minutes.  Bilateral tubal ligation was performed  without difficulty.  The patient tolerated the procedure well and taken  to the recovery room in stable condition.  On postoperative day 1, the  patient was without complaint.  Vital signs were stable.  She was  afebrile.  Fundus was firm and nontender.  Abdominal dressing was noted  to be clean, dry, and intact.  Subcuticular closure was noted.  Laboratory findings revealed hemoglobin of 10.2, platelet count 148,000,  blood type was known to be O positive.  On postoperative day 2,  the  patient was without complaint.  Vital signs were stable.  She was  afebrile.  Fundus firm and nontender.  Incision was clean, dry, and  intact.  On postoperative day 3, the patient was without complaint.  Vital signs remained stable.  She was afebrile.  Fundus firm and  nontender.  Incision was clean, dry, and intact.  Discharge instructions  were reviewed, and the patient was later discharged home.   CONDITION ON DISCHARGE:  Stable.   DIET:  Regular as tolerated.   ACTIVITY:  No heavy lifting, no driving x2 weeks, no vaginal entry.   FOLLOWUP:  The patient to follow up in the office in 1-2 weeks for an  incision check.  She is to call for temperature greater than 100  degrees, persistent nausea, vomiting, heavy vaginal bleeding, and/or  redness or drainage from the incisional site.   DISCHARGE MEDICATIONS:  1. Tylox #30 one p.o. every 4-6 hours p.r.n.  2. Motrin 600 mg every 6 hours.  3. Prenatal vitamins 1 p.o. daily.  4. Colace 1 p.o. daily p.r.n.      Julio Sicks, N.P.  Richard M. Sandra Key, M.D.  Electronically Signed    CC/MEDQ  D:  12/26/2008  T:  12/26/2008  Job:  [id number]

## 2010-10-15 NOTE — Discharge Summary (Signed)
NAME:  Sandra Key, Sandra Key               ACCOUNT NO.:  [id number]   MEDICAL RECORD NO.:  [id number]          PATIENT TYPE:  INP   LOCATION:  9143                          [facility]   PHYSICIAN:  Guy Sandifer. Henderson Cloud, M.D. DATE OF BIRTH:  [birth year]   DATE OF ADMISSION:  05/21/2007  DATE OF DISCHARGE:  05/24/2007                               DISCHARGE SUMMARY   ADMITTING DIAGNOSES:  1. Intrauterine pregnancy at 35 weeks' estimated gestational age.  2. Previous cesarean section, desires repeat.   DISCHARGE DIAGNOSES:  1. Status post low transverse cesarean section.  2. Viable female infant.   PROCEDURE:  Repeat low transverse cesarean section.   REASON FOR ADMISSION:  Please see written H&P.   HOSPITAL COURSE:  The patient is a 40 year old gravida 5, para 1 who was  admitted to [facility] for scheduled cesarean section.  The patient had had a previous cesarean delivery for cephalopelvic  disproportion.  The patient did desire repeat.  On the morning of  admission, the patient was taken to the operating room where spinal  anesthesia was administered without difficulty.   A low transverse incision was made with delivery of a viable female infant  weighing 8 pounds 1 ounce with Apgars of 9 at one minute and 9 at five  minutes and arterial cord pH of 7.31.  The patient tolerated the  procedure well and was taken to the recovery room in stable condition.   On postoperative day #1, the patient was without complaint.  Vital signs  were stable.  She was afebrile.  The fundus was firm and nontender.  The  incision was clean, dry, and intact.  Laboratory findings revealed a  hemoglobin of 10.4.  On postoperative day #2, the patient was without  complaint.  Vital signs were stable.  She is afebrile.  The fundus was  firm and nontender.  She was ambulating well and tolerating a regular  diet without complaints of nausea or vomiting.  On postoperative day #3,  the patient was  without complaint.  Vital signs were stable.  She was  afebrile.  Fundus was firm and nontender.  The incision was clean, dry,  and intact.  Staples were removed, and the patient was later discharged  home.   CONDITION ON DISCHARGE:  Good.   DIET:  Regular as tolerated.   ACTIVITY:  No heavy lifting, no driving x2 weeks, no vaginal entry.   FOLLOWUP:  The patient is to follow up in the office in 1-2 weeks for an  incision check.  She is to call for temperature greater than 100  degrees, persistent nausea or vomiting, heavy vaginal bleeding, and/or  redness or drainage from incisional site.   DISCHARGE MEDICATIONS:  1. Tylox 1 p.o. q.4-6h., #30.  2. Motrin 600 mg every 6 hours.  3. Prenatal vitamins 1 p.o. daily.  4. Colace n.p.o. daily.      Julio Sicks, N.P.      Guy Sandifer. Henderson Cloud, M.D.  Electronically Signed    CC/MEDQ  D:  06/19/2007  T:  06/19/2007  Job:  [id number]

## 2010-10-15 NOTE — H&P (Signed)
Sandra Key, Sandra Key               ACCOUNT NO.:  [id number]   MEDICAL RECORD NO.:  [id number]         PATIENT TYPE:  WINP   LOCATION:                                [facility]   PHYSICIAN:  Duke Salvia. Marcelle Overlie, M.D.DATE OF BIRTH:  1970/12/20   DATE OF ADMISSION:  12/07/2008  DATE OF DISCHARGE:                              HISTORY & PHYSICAL   __________   DATE OF SURGERY:  December 07, 2008.   CHIEF COMPLAINT:  For repeat cesarean section and tubal ligation at  term.   HISTORY OF PRESENT ILLNESS:  A 40 year old G6, P 2-0-3-2 at term,  previous cesarean section x2 presents for RCS plus tubal ligation.  She  is sure she would not want to be pregnant again under any circumstance.  The permanence of this procedure, including risks related to bleeding,  infection, transfusion, adjacent organ injury, the permanence of the  tubal procedure and failure rate of 2-3 per 1000 reviewed with her which  she understands and accepts.   Her GBS is negative.  One-hour GTT was 108.  Last ultrasound on November 03, 2008, showed a low-lying posterior placenta.  At that time was  transverse lie, more recently it has been felt to be vertex by  Leopold's.   Blood type is O+.   PAST MEDICAL HISTORY:   ALLERGIES:  HYDROCODONE.   PAST MEDICAL HISTORY:  Please see [place] form for details.  She has  had a prior breast reduction and cesarean section x2, along with a  history of HSV in the past.   PHYSICAL EXAMINATION:  VITAL SIGNS:  Temperature 98.2, blood pressure  100/68.  HEENT:  Unremarkable.  NECK:  Supple without masses.  LUNGS:  Clear.  CARDIOVASCULAR:  Regular rate and rhythm without murmurs, rubs or  gallops noted.  BREASTS:  Not examined.  Term fundal height.  Fetal heart rate 140.  Cervix was closed.  EXTREMITIES/NEUROLOGIC:  Unremarkable.   IMPRESSION:  Term intrauterine pregnancy.   PLAN:  Repeat cesarean section, tubal ligation.  The procedure and risks  reviewed as  above.      Richard M. Marcelle Overlie, M.D.  Electronically Signed     RMH/MEDQ  D:  12/06/2008  T:  12/06/2008  Job:  [id number]

## 2010-10-18 NOTE — Op Note (Signed)
[facility]  Patient:    Sandra Key, Sandra Key                   MRN: [id number] Proc. Date: 04/16/00 Adm. Date:  [id number] Attending:  Deniece Ree                           Operative Report  PREOPERATIVE DIAGNOSES:       Spontaneous missed abortion.  OPERATION:                    Dilatation and evacuation and curettage.  POSTOPERATIVE DIAGNOSES:      Spontaneous missed abortion, sent to pathology.  SURGEON:                      Deniece Ree, M.D.  ANESTHESIA:                   MAC.  ESTIMATED BLOOD LOSS:         150 cc.  COMPLICATIONS:                None.  DISPOSITION:                  The patient tolerated the procedure well and returned to the recovery room in satisfactory condition.  DESCRIPTION OF PROCEDURE:     The patient was taken to the operating room and prepped and draped in the usual fashion for dilatation and evacuation. Speculum was placed in the vagina, at which time the anterior lip of the cervix was then grasped with a Christella Hartigan tenaculum.  A paracervical block was then instituted at 7 and 5 positions.  The cervix was already dilated to approximately 18 Pratt dilator.  With a size 10 suction curette, evacuation of uterine cavity was then carried out in the routine fashion removing a moderate amount of products of conception.  After this was done, a sharp curette was utilized.  The uterus was scraped and again felt to be clean.  After this was completed, the reevacuation of the endometrial cavity was carried out, at which time very minimal bleeding was present.  The procedure was then terminated.  The patient tolerated the procedure well and returned to the recovery room in satisfactory condition.  The patient is to be discharged when fully alert.  She has been instructed of the possible complications for this type of surgery.  FOLLOW-UP:                    She is told to return to my office in four weeks for follow-up  evaluation or to call me before that time should any problems arise. DD:  04/16/00 TD:  04/16/00 Job: [id number]

## 2010-10-18 NOTE — Assessment & Plan Note (Signed)
[facility]                                 ON-CALL NOTE   NAME:Key, Sandra                   MRN:          [id number]  DATE:10/15/2006                            DOB:          13-Feb-1971    TIME:  8:25 p.m.   PHONE NUMBER:  [phone].   PRIMARY CARE PHYSICIAN:  Neta Mends. Panosh, M.D.   HOME OFFICE:  [facility].   OBJECTIVE:  Patient called about 15-20 minutes ago.  She was  unavailable, and I left a message on the machine.  She now calls back.  Her lips of the left side of her mouth are drooping.  She is able to  close her eyes.  She has a mild decrease in sensation around the mouth,  not on the rest of the face, otherwise is asymptomatic.   ASSESSMENT:  Possible Bell's palsy in the early stages.   PLAN:  I tried to discuss the etiology of Bell's palsy and the symptoms  she would have.  She is worried about a stroke, and I told her typically  she would have symptoms of the faceand  head on one side and the body  and extremities, on the other side, which she has not had.  We discussed  Bell's palsy as far as treatment.  Patient is now eight weeks pregnant  and is wondering if there is any problem there, which I do not think  there is.  If things get worse or if she develops symptoms that seem to  be likely of stroke, I suggest that she go to the hospital for possible  stroke treatment.  Otherwise, could see Dr. Fabian Sharp tomorrow if really  needed but otherwise observation is reasonable.  I told her if she  starts having problems not being able to close the eye, the most  important thing is to keep the eye well-hydrated.     Arta Silence, MD  Electronically Signed    RNS/MedQ  DD: 10/15/2006  DT: 10/16/2006  Job #: [id number]

## 2010-10-18 NOTE — H&P (Signed)
[facility]  Patient:    Sandra Key, Sandra Key                   MRN: [id number] Adm. Date:  [id number] Attending:  Deniece Ree                         History and Physical  HISTORY:                      The patient is a 40 year old female who was felt to be approximately [redacted] weeks pregnant on her prior evaluation.  At the time the patient was evaluated, no fetal heart tones were obtained.  Therefore an ultrasound was obtained, which revealed the fetal demise at approximately nine weeks.  This was explained to the patient and she was admitted for an outpatient dilatation and evacuation.  PAST MEDICAL HISTORY/FAMILY HISTORY/REVIEW OF SYSTEMS:  Noncontributory.  PHYSICAL EXAMINATION:  GENERAL:                      A well-developed, well-nourished female, in no acute distress.  HEENT:                        Within normal limits.  NECK:                         Supple.  BREASTS:                      Without masses, tenderness, or discharge.  LUNGS:                        Clear to percussion and auscultation.  HEART:                        Normal sinus rhythm, without murmur, rub, or gallop.  ABDOMEN:                      Benign.  EXTREMITIES:                  Within normal limits.  NEUROLOGIC:                   Within normal limits.  PELVIC:                       Revealed the external genitalia and BUS to be within normal limits.  The vagina is clear.  Cervix is firm and nontender. The uterus is approximately 10 weeks in size.  Adnexa benign.  ADMISSION DIAGNOSES:          Spontaneous missed abortion.  PLAN:                         For a dilatation and evacuation. DD:  04/16/00 TD:  04/16/00 Job: [id number]

## 2010-10-18 NOTE — Assessment & Plan Note (Signed)
[facility]                                 ON-CALL NOTE   NAME:PITCHFORDCherylyn, Sundby                   MRN:          [id number]  DATE:09/05/2006                            DOB:          [birth year]    PRIMARY:  Neta Mends. Panosh, MD   Phone number is [phone]   SUBJECTIVE:  Headaches related to sinuses.   ASSESSMENT/PLAN:  Saturday clinic.     Kerby Nora, MD  Electronically Signed    AB/MedQ  DD: 09/05/2006  DT: 09/05/2006  Job #: [id number]

## 2010-10-18 NOTE — Assessment & Plan Note (Signed)
[facility]                                 ON-CALL NOTE   NAME:[name], [name]                   MRN:          [id number]  DATE:10/15/2006                            DOB:          1970-12-26    TIME OF CALL:  8:25pm.   PHONE NUMBER:  [phone]   OBJECTIVE:  The patient's lips are drooping to the left. That is the  message left on my pager. When calling the number, there was no answer.  I left a message on the machine to give me a call back at the on call  number and I will call her back again. I told her that she may very well  have Bell's Palsy and may have trouble closing her eye at night and  needs to be careful of not letting her eye to dry out and otherwise to  give me a call back if she has any questions.   PRIMARY CARE PHYSICIAN:  Dr. Fabian Sharp, home office is [facility].     Arta Silence, MD  Electronically Signed    RNS/MedQ  DD: 10/15/2006  DT: 10/16/2006  Job #: [id number]

## 2010-10-18 NOTE — Op Note (Signed)
[place]. [facility]  Patient:    Sandra Key, Sandra Key Visit Number: [id number] MRN: [id number]          Service Type: DSU Location: [facility] Attending Physician:  Gustavus Messing Dictated by:   Yaakov Guthrie. Shon Hough, M.D. Admit Date:  11/29/2001                             Operative Report  CLINICAL NOTE:  A 40 year old lady with severe, severe macromastia, back and shoulder pain, secondary to large, pendulous breasts.  PROCEDURES: 1. Bilateral breast reductions using the inferior pedicle technique. 2. Excision of accessory breast tissues.  SURGEON: Yaakov Guthrie. Shon Hough, M.D.  ANESTHESIA:  General.  ESTIMATED BLOOD LOSS:  Less than 150 cc.  COMPLICATIONS:  None.  DESCRIPTION OF PROCEDURE:  Preoperatively the patient was sat up and drawn for the inferior pedicle reduction mammoplasty, reblocking the nipple-areolar complex up to 20 cm from the suprasternal notch.  She then underwent general anesthesia, intubated orally, and prep was done to the chest and breast areas in routine fashion using Betadine soap and solution and walled off with sterile towels and drapes so as to make a sterile field.  Xylocaine 0.25% with epinephrine at 1:400,000 concentration was injected locally, a total of 150 cc.  After waiting the appropriate amount of time, the skin over the inferior pedicle was de-epithelialized using a #20 blade, and medial and lateral fatty dermal pedicles were excised down to underlying fascia.  Out laterally more tissue was removed and accessory breast tissue.  After proper hemostasis, the flaps were then thinned out a little bit more, a new keyhole area was debulked, and then the flaps transposed and stayed with 3-0 Prolene, the subcutaneous closure done with 3-0 Monocryl x2 layers, and then a running subcuticular stitch of 3-0 Monocryl and 5-0 Monocryl throughout the inverted T.  The wounds were cleansed.  The wounds were drained with #10 Blake  drains, which were placed in the depths of the wound and brought out through the lateralmost portion of the incision and secured with 3-0 Prolene. Steri-Strips and a soft dressing were applied, and all the areas were covered with Xeroform, 4 x 4s, ABDs, and Hypafix tape.  She withstood the procedures very well, was taken to recovery in excellent condition.  At the end of the procedure, the nipple-areolar complexes were examined and showed excellent blood supply. Dictated by:   Yaakov Guthrie. Shon Hough, M.D. Attending Physician:  Gustavus Messing DD:  11/29/01 TD:  11/30/01 Job: [id number]

## 2011-02-07 ENCOUNTER — Encounter: Payer: Self-pay | Admitting: Internal Medicine

## 2011-02-07 ENCOUNTER — Ambulatory Visit (INDEPENDENT_AMBULATORY_CARE_PROVIDER_SITE_OTHER): Payer: BC Managed Care – PPO | Admitting: Internal Medicine

## 2011-02-07 VITALS — BP 110/70 | HR 78 | Wt 201.0 lb

## 2011-02-07 DIAGNOSIS — F4323 Adjustment disorder with mixed anxiety and depressed mood: Secondary | ICD-10-CM

## 2011-02-07 DIAGNOSIS — G479 Sleep disorder, unspecified: Secondary | ICD-10-CM

## 2011-02-07 DIAGNOSIS — B009 Herpesviral infection, unspecified: Secondary | ICD-10-CM

## 2011-02-07 MED ORDER — ZOLPIDEM TARTRATE 10 MG PO TABS: 10.0000 mg | ORAL_TABLET | Freq: Every evening | ORAL | Status: DC | PRN

## 2011-02-07 MED ORDER — ESCITALOPRAM OXALATE 10 MG PO TABS: 10.0000 mg | ORAL_TABLET | Freq: Every day | ORAL | Status: DC

## 2011-02-07 MED ORDER — VALACYCLOVIR HCL 1 G PO TABS: 1000.0000 mg | ORAL_TABLET | Freq: Two times a day (BID) | ORAL | Status: DC

## 2011-02-07 NOTE — Patient Instructions (Signed)
Continue lexapro and counseling  Sleep aid as discussed to limit side effects. Call with info  Will work on form  Get your counselor to  Write a support synopsis also.

## 2011-02-07 NOTE — Progress Notes (Signed)
  Subjective:    Patient ID: Sandra Key, female    DOB: 1970-10-02, 40 y.o.   MRN: [id number]  HPI Patient comes in today for followup of medication and other issues. Since her last visit in May she is than on the Lexapro which has helped her some Med helps with some callness.   Some better . 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 Her job included following 28 sixth grader is most with behavior problems. t she is in counseling about every other week to one month because of financial considerations. She is now separated from her husband has 3 children at home and one autistic finances are difficult.  Mother is helping her with her insurance premiums Here for stresss related effects of life .   And was stressed out.      And had physical sx . A stress return much better and she is able to handle him at present Sleep not that good  . Asking for a few Ambien to see if this would help is aware of the risk of it. Needs refill of valtrex   Review of Systems Gated for fever weight loss has a cold today no nausea vomiting severe depression other new medical problems.  Past history family history social history reviewed in the electronic medical record.     Objective:   Physical Exam Well-developed well-nourished in no acute distress somewhat congested cognition intact. HEENT: Normocephalic ;atraumatic , Eyes;  PERRL, EOMs  Full, lids and conjunctiva clear,,Ears: no deformities, canals nl, TM landmarks normal, Nose: no deformity or discharge congested Mouth : OP clear without lesion or edema . Neck supple Chest:  Clear to A&P without wheezes rales or rhonchi CV:  S1-S2 no gallops or murmurs peripheral perfusion is normal Neuro grossly non focal No clubbing cyanosis or edema Oriented x 3 and no noted deficits in memory, attention, and speech. She has normal thought process likely sad and tearful at  times.     Assessment & Plan:  Reactive mood disorder adjustment reaction. Physical symptoms are better on medication. She is currently in counseling which I strongly agree with.  In the meantime this has affected her job performance and she has had to resign from her job .   We'll fill out her temporary disability form but will need to get supporting information from her counselor. At this time will remain on medication and followup in a few months.   Significant sleep disturbance   reviewed sleep hygiene no alcohol or other drugs. Risk benefits discussed and can try Ambien as needed. Avoid dependency. URI self resolving Recurrent HSV refill medication  Total visit > 50% spent counseling and coordinating care   Will complete form call about specific  info.

## 2011-02-20 LAB — COMPREHENSIVE METABOLIC PANEL
BUN: 8
CO2: 25
Calcium: 9.1
Chloride: 109
GFR calc Af Amer: >60
Potassium: 3.6
Sodium: 142
Total Bilirubin: 0.6

## 2011-02-20 LAB — URINALYSIS, ROUTINE W REFLEX MICROSCOPIC
Bilirubin Urine: NEGATIVE
Glucose, UA: NEGATIVE
Ketones, ur: NEGATIVE
Protein, ur: NEGATIVE
Specific Gravity, Urine: 1.015
pH: 7

## 2011-02-20 LAB — CBC
MCV: 88.8
Platelets: 378
RDW: 14.6

## 2011-03-07 LAB — CBC
HCT: 30.4 — ABNORMAL LOW
Hemoglobin: 10.4 — ABNORMAL LOW
Hemoglobin: 13.1
MCV: 89.3
MCV: 89.8
RBC: 4.34
WBC: 10.4
WBC: 11.5 — ABNORMAL HIGH

## 2011-03-07 LAB — RPR: RPR Ser Ql: NONREACTIVE

## 2011-03-12 LAB — URINALYSIS, ROUTINE W REFLEX MICROSCOPIC
Bilirubin Urine: NEGATIVE
Hgb urine dipstick: NEGATIVE
Ketones, ur: NEGATIVE
Specific Gravity, Urine: 1.01
pH: 7

## 2011-03-14 LAB — URINALYSIS, ROUTINE W REFLEX MICROSCOPIC
Glucose, UA: NEGATIVE
Hgb urine dipstick: NEGATIVE
Specific Gravity, Urine: 1.015
pH: 7

## 2011-07-04 LAB — HM PAP SMEAR

## 2011-07-14 ENCOUNTER — Encounter (HOSPITAL_COMMUNITY): Payer: Self-pay

## 2011-07-14 ENCOUNTER — Emergency Department (HOSPITAL_COMMUNITY)
Admission: EM | Admit: 2011-07-14 | Discharge: 2011-07-14 | Disposition: A | Discharge status: Home / Self Care | Payer: Self-pay | Attending: Emergency Medicine | Admitting: Emergency Medicine

## 2011-07-14 DIAGNOSIS — J45909 Unspecified asthma, uncomplicated: Secondary | ICD-10-CM | POA: Insufficient documentation

## 2011-07-14 DIAGNOSIS — K439 Ventral hernia without obstruction or gangrene: Secondary | ICD-10-CM | POA: Insufficient documentation

## 2011-07-14 DIAGNOSIS — R109 Unspecified abdominal pain: Secondary | ICD-10-CM | POA: Insufficient documentation

## 2011-07-14 LAB — URINALYSIS, ROUTINE W REFLEX MICROSCOPIC
Glucose, UA: NEGATIVE mg/dL
Leukocytes, UA: NEGATIVE
Protein, ur: NEGATIVE mg/dL
Specific Gravity, Urine: 1.011 (ref 1.005–1.030)
pH: 7 (ref 5.0–8.0)

## 2011-07-14 LAB — PREGNANCY, URINE: Preg Test, Ur: NEGATIVE

## 2011-07-14 NOTE — ED Notes (Addendum)
Pt complains of abd pain around naval area, denies vaginal complaints, nausea, vomiting, urinary or bowel changes. sts getting worse is why she came today, sts stomach feels hard also, sts feels a bulge, sts since she had her son has noticed her naval protruding no diagnosed hernia.

## 2011-07-14 NOTE — ED Provider Notes (Signed)
History     CSN: [id number]  Arrival date & time 07/14/11  5397   First MD Initiated Contact with Patient 07/14/11 [phone]      Chief Complaint  Patient presents with  . Abdominal Pain    (Consider location/radiation/quality/duration/timing/severity/associated sxs/prior treatment) Patient is a 41 y.o. female presenting with abdominal pain. The history is provided by the patient.  Abdominal Pain The primary symptoms of the illness include abdominal pain. The primary symptoms of the illness do not include fever, nausea, vomiting, diarrhea, dysuria or vaginal bleeding. The current episode started more than 2 days ago. The onset of the illness was gradual. The problem has been gradually worsening.  Symptoms associated with the illness do not include chills or constipation.  Pt states since she gave birth several years ago, she has had a "bulge" in her abdomen, around her umbilicus. States bulge gets bigger as she stands  Up. Intermittent pain in that area on and off, worse in last two weeks. Denies nausea, vomiting, fever, diarrhea, constipation. Norma bowel movements. No urinary symptoms.   Past Medical History  Diagnosis Date  . Allergic rhinitis     History of allergy testing positive  . Asthma   . Recurrent pregnancy loss   . Tinnitus, right     Had mri  and eustachian tube dysfunction    Past Surgical History  Procedure Date  . Cesarean section   . Breast reduction surgery   . Tympanostomy tube placement 2009    right      History reviewed. No pertinent family history.  History  Substance Use Topics  . Smoking status: Never Smoker   . Smokeless tobacco: Not on file  . Alcohol Use: Yes     occ.    OB History    Grav Para Term Preterm Abortions TAB SAB Ect Mult Living   2 2              Review of Systems  Constitutional: Negative for fever and chills.  HENT: Negative.   Eyes: Negative.   Respiratory: Negative.   Cardiovascular: Negative.   Gastrointestinal:  Positive for abdominal pain. Negative for nausea, vomiting, diarrhea and constipation.  Genitourinary: Negative for dysuria, flank pain, vaginal bleeding, menstrual problem and pelvic pain.  Musculoskeletal: Negative.   Skin: Negative.   Neurological: Negative.   Psychiatric/Behavioral: Negative.     Allergies  Latex; Codeine phosphate; Hydrocodone; Peanut-containing drug products; and Shellfish allergy  Home Medications   Current Outpatient Rx  Name Route Sig Dispense Refill  . DOXYLAMINE SUCCINATE (SLEEP) 25 MG PO TABS Oral Take 25 mg by mouth at bedtime as needed. For sleep    . ALBUTEROL SULFATE HFA 108 (90 BASE) MCG/ACT IN AERS Inhalation Inhale 2 puffs into the lungs every 6 (six) hours as needed. For shortness of breath    . CETIRIZINE HCL 10 MG PO TABS Oral Take 10 mg by mouth daily as needed. For allergies      BP 97/71  Pulse 68  Temp(Src) 97.3 F (36.3 C) (Oral)  Resp 20  SpO2 99%  LMP 06/23/2011  Physical Exam  Nursing note and vitals reviewed. Constitutional: She is oriented to person, place, and time. She appears well-developed and well-nourished. No distress.  HENT:  Head: Normocephalic.  Neck: Neck supple.  Cardiovascular: Normal rate, regular rhythm and normal heart sounds.   Pulmonary/Chest: Effort normal and breath sounds normal. No respiratory distress.  Abdominal: Soft. Bowel sounds are normal. There is no tenderness.  Periumbilical hernia present, soft, nonbulging  Musculoskeletal: Normal range of motion.  Neurological: She is alert and oriented to person, place, and time.  Skin: Skin is warm and dry. No rash noted.  Psychiatric: She has a normal mood and affect.    ED Course  Procedures (including critical care time)  Pt with abdominal wall hernia. No complaints currently. No abdominal pain, abdomen soft, non tender. Hernia palpable, but soft, reduced. Pt having normal bowel movement, no nausea, vomiting. Will d/c home with surgery follow up.    No diagnosis found.    MDM          Lottie Mussel, PA 07/14/11 1624

## 2011-07-14 NOTE — ED Provider Notes (Signed)
41 year old female has noted a painful knot that comes up adjacent to her umbilicus which bothers her mainly when she is standing. Exam shows an umbilical hernia. I discussed the treatment options with her and will refer her to [facility] surgery for possible surgical treatment.  Dione Booze, MD 07/14/11 1037

## 2011-07-15 NOTE — ED Provider Notes (Signed)
Medical screening examination/treatment/procedure(s) were conducted as a shared visit with non-physician practitioner(s) and myself.  I personally evaluated the patient during the encounter   Dione Booze, MD 07/15/11 [phone]

## 2011-08-19 ENCOUNTER — Other Ambulatory Visit (HOSPITAL_COMMUNITY): Payer: Self-pay | Admitting: Obstetrics & Gynecology

## 2011-08-19 DIAGNOSIS — Z1231 Encounter for screening mammogram for malignant neoplasm of breast: Secondary | ICD-10-CM

## 2011-08-26 ENCOUNTER — Ambulatory Visit (HOSPITAL_COMMUNITY): Payer: Self-pay

## 2011-09-25 ENCOUNTER — Ambulatory Visit (HOSPITAL_COMMUNITY)
Admission: RE | Admit: 2011-09-25 | Discharge: 2011-09-25 | Disposition: A | Discharge status: Home / Self Care | Payer: Self-pay | Source: Ambulatory Visit | Attending: Obstetrics & Gynecology | Admitting: Obstetrics & Gynecology

## 2011-09-25 DIAGNOSIS — Z1231 Encounter for screening mammogram for malignant neoplasm of breast: Secondary | ICD-10-CM

## 2012-06-30 ENCOUNTER — Other Ambulatory Visit: Payer: Self-pay

## 2012-07-07 ENCOUNTER — Encounter: Payer: Self-pay | Admitting: Internal Medicine

## 2012-07-13 ENCOUNTER — Emergency Department (HOSPITAL_COMMUNITY)
Admission: EM | Admit: 2012-07-13 | Discharge: 2012-07-14 | Disposition: A | Discharge status: Home / Self Care | Payer: Self-pay | Attending: Emergency Medicine | Admitting: Emergency Medicine

## 2012-07-13 ENCOUNTER — Emergency Department (HOSPITAL_COMMUNITY): Payer: Self-pay

## 2012-07-13 ENCOUNTER — Encounter (HOSPITAL_COMMUNITY): Payer: Self-pay | Admitting: Emergency Medicine

## 2012-07-13 DIAGNOSIS — J45909 Unspecified asthma, uncomplicated: Secondary | ICD-10-CM | POA: Insufficient documentation

## 2012-07-13 DIAGNOSIS — Z8669 Personal history of other diseases of the nervous system and sense organs: Secondary | ICD-10-CM | POA: Insufficient documentation

## 2012-07-13 DIAGNOSIS — M546 Pain in thoracic spine: Secondary | ICD-10-CM | POA: Insufficient documentation

## 2012-07-13 DIAGNOSIS — R079 Chest pain, unspecified: Secondary | ICD-10-CM | POA: Insufficient documentation

## 2012-07-13 LAB — CBC WITH DIFFERENTIAL/PLATELET
Basophils Relative: 0 % (ref 0–1)
Hemoglobin: 13.2 g/dL (ref 12.0–15.0)
MCHC: 33.1 g/dL (ref 30.0–36.0)
Monocytes Relative: 4 % (ref 3–12)
Neutro Abs: 5.5 10*3/uL (ref 1.7–7.7)
Neutrophils Relative %: 59 % (ref 43–77)
RBC: 4.79 MIL/uL (ref 3.87–5.11)
WBC: 9.3 10*3/uL (ref 4.0–10.5)

## 2012-07-13 LAB — COMPREHENSIVE METABOLIC PANEL
AST: 18 U/L (ref 0–37)
Albumin: 4 g/dL (ref 3.5–5.2)
Alkaline Phosphatase: 45 U/L (ref 39–117)
BUN: 14 mg/dL (ref 6–23)
Chloride: 104 mEq/L (ref 96–112)
Potassium: 4 mEq/L (ref 3.5–5.1)
Total Bilirubin: 0.2 mg/dL — ABNORMAL LOW (ref 0.3–1.2)

## 2012-07-13 NOTE — ED Notes (Signed)
Pt presents with right upper back pain for the past 2 days, denies any injuries, denies shoulder pain.  Pt also complaining of chest pain that began today, pain worse when pt takes a deep breath.  Placed on cardiac monitor, NAD noted at this time.

## 2012-07-13 NOTE — ED Provider Notes (Signed)
History     CSN: [id number]  Arrival date & time 07/13/12  2135   First MD Initiated Contact with Patient 07/13/12 2302      Chief Complaint  Patient presents with  . Back Pain  . Chest Pain    (Consider location/radiation/quality/duration/timing/severity/associated sxs/prior treatment) Patient is a 42 y.o. female presenting with back pain and chest pain. The history is provided by the patient.  Back Pain Location:  Thoracic spine Quality:  Stabbing Radiates to:  Does not radiate Pain severity:  Mild Pain is:  Worse during the day Onset quality:  Sudden Duration:  1 day Timing:  Constant Progression:  Worsening Relieved by:  NSAIDs Worsened by:  Nothing tried Ineffective treatments:  None tried Associated symptoms: chest pain   Chest Pain Pain location:  R lateral chest Pain quality: aching   Pain radiates to:  Mid back Pain radiates to the back: yes   Pain severity:  Moderate Onset quality:  Gradual Duration:  3 days Timing:  Constant Progression:  Worsening Chronicity:  New Relieved by:  Nothing Worsened by:  Deep breathing Ineffective treatments:  None tried Associated symptoms: back pain     Past Medical History  Diagnosis Date  . Allergic rhinitis     History of allergy testing positive  . Asthma   . Recurrent pregnancy loss   . Tinnitus, right     Had mri  and eustachian tube dysfunction    Past Surgical History  Procedure Laterality Date  . Cesarean section    . Breast reduction surgery    . Tympanostomy tube placement  2009    right      No family history on file.  History  Substance Use Topics  . Smoking status: Never Smoker   . Smokeless tobacco: Not on file  . Alcohol Use: Yes     Comment: occ.    OB History   Grav Para Term Preterm Abortions TAB SAB Ect Mult Living   2 2              Review of Systems  Cardiovascular: Positive for chest pain.  Musculoskeletal: Positive for back pain.  All other systems reviewed and are  negative.    Allergies  Latex; Codeine phosphate; Hydrocodone; Peanut-containing drug products; and Shellfish allergy  Home Medications   Current Outpatient Rx  Name  Route  Sig  Dispense  Refill  . GARCINIA CAMBOGIA-CHROMIUM PO   Oral   Take 1 capsule by mouth 3 (three) times daily as needed (weight control).           BP 111/70  Pulse 74  Temp(Src) 97.8 F (36.6 C) (Oral)  Resp 17  SpO2 100%  LMP 07/12/2012  Physical Exam  Constitutional: She is oriented to person, place, and time. She appears well-developed and well-nourished.  HENT:  Head: Normocephalic and atraumatic.  Eyes: Conjunctivae and EOM are normal. Pupils are equal, round, and reactive to light.  Neck: Normal range of motion.  Cardiovascular: Normal rate, regular rhythm and normal heart sounds.   Pulmonary/Chest: Effort normal and breath sounds normal.  Abdominal: Soft. Bowel sounds are normal.  Musculoskeletal: Normal range of motion.  Neurological: She is alert and oriented to person, place, and time.  Skin: Skin is warm and dry.  Psychiatric: She has a normal mood and affect. Her behavior is normal.    ED Course  Procedures (including critical care time)  Labs Reviewed  COMPREHENSIVE METABOLIC PANEL - Abnormal; Notable for the following:  Total Bilirubin 0.2 (*)    All other components within normal limits  CBC WITH DIFFERENTIAL  POCT I-STAT TROPONIN I   Dg Chest 2 View  07/13/2012  *RADIOLOGY REPORT*  Clinical Data: Chest pain.  Back pain.  CHEST - 2 VIEW  Comparison: Report from 05/20/2001  Findings: Upper normal heart size.  No edema or findings of pulmonary venous hypertension.  Poorly defined density projects over the right seventh rib, measuring about 1.4 x 0.8 cm.  The lungs are otherwise clear.  No pleural effusion noted.  IMPRESSION: 1.  Possible pulmonary nodule in the right mid lung.  I recommend either an apical lordotic view of the chest or chest CT for further characterization.    Original Report Authenticated By: Gaylyn Rong, M.D.      No diagnosis found.   Date: 07/13/2012  Rate: 62  Rhythm: normal sinus rhythm  QRS Axis: normal  Intervals: normal  ST/T Wave abnormalities: normal  Conduction Disutrbances: none  Narrative Interpretation: unremarkable     MDM  + pleuritic chest pain, pulmonary nodule.  Will ct chest,  reassess        Rosanne Ashing, MD 07/13/12 2322

## 2012-07-13 NOTE — ED Notes (Signed)
PT. REPORTS MID BACK PAIN ONSET LAST Saturday AND RIGHT CHEST PAIN TODAY , DENIES SOB OR COUGH , NO NAUSEA OR DIAPHORESIS.

## 2012-07-14 ENCOUNTER — Emergency Department (HOSPITAL_COMMUNITY): Payer: Self-pay

## 2012-07-14 MED ORDER — IOHEXOL 350 MG/ML SOLN
100.0000 mL | Freq: Once | INTRAVENOUS | Status: AC | PRN
  Administered 2012-07-14: 100 mL via INTRAVENOUS

## 2012-07-14 MED ORDER — NAPROXEN 375 MG PO TABS: 375.0000 mg | ORAL_TABLET | Freq: Two times a day (BID) | ORAL | Status: DC

## 2012-07-14 MED ORDER — ESOMEPRAZOLE MAGNESIUM 40 MG PO CPDR: 40.0000 mg | DELAYED_RELEASE_CAPSULE | Freq: Every day | ORAL | Status: DC

## 2012-07-14 NOTE — ED Notes (Signed)
Patient transported to CT 

## 2012-07-14 NOTE — ED Notes (Signed)
Pt returned from CT, placed back on continuous cardiac monitor and pulse ox.

## 2012-09-15 ENCOUNTER — Other Ambulatory Visit (INDEPENDENT_AMBULATORY_CARE_PROVIDER_SITE_OTHER): Payer: PRIVATE HEALTH INSURANCE

## 2012-09-15 DIAGNOSIS — Z0000 Encounter for general adult medical examination without abnormal findings: Secondary | ICD-10-CM

## 2012-09-15 LAB — HEPATIC FUNCTION PANEL
Bilirubin, Direct: 0 mg/dL (ref 0.0–0.3)
Total Bilirubin: 0.7 mg/dL (ref 0.3–1.2)
Total Protein: 7 g/dL (ref 6.0–8.3)

## 2012-09-15 LAB — CBC WITH DIFFERENTIAL/PLATELET
Eosinophils Absolute: 0.1 10*3/uL (ref 0.0–0.7)
MCHC: 33.2 g/dL (ref 30.0–36.0)
MCV: 83.2 fl (ref 78.0–100.0)
Monocytes Absolute: 0.3 10*3/uL (ref 0.1–1.0)
Neutrophils Relative %: 68.4 % (ref 43.0–77.0)
Platelets: 280 10*3/uL (ref 150.0–400.0)

## 2012-09-15 LAB — LIPID PANEL
Cholesterol: 195 mg/dL (ref 0–200)
LDL Cholesterol: 124 mg/dL — ABNORMAL HIGH (ref 0–99)
Triglycerides: 114 mg/dL (ref 0.0–149.0)
VLDL: 22.8 mg/dL (ref 0.0–40.0)

## 2012-09-15 LAB — BASIC METABOLIC PANEL
BUN: 9 mg/dL (ref 6–23)
CO2: 23 mEq/L (ref 19–32)
Chloride: 108 mEq/L (ref 96–112)
Creatinine, Ser: 0.8 mg/dL (ref 0.4–1.2)
Glucose, Bld: 93 mg/dL (ref 70–99)

## 2012-09-22 ENCOUNTER — Encounter: Payer: Self-pay | Admitting: Internal Medicine

## 2012-09-22 ENCOUNTER — Ambulatory Visit (INDEPENDENT_AMBULATORY_CARE_PROVIDER_SITE_OTHER): Payer: PRIVATE HEALTH INSURANCE | Admitting: Internal Medicine

## 2012-09-22 ENCOUNTER — Other Ambulatory Visit (HOSPITAL_COMMUNITY)
Admission: RE | Admit: 2012-09-22 | Discharge: 2012-09-22 | Disposition: A | Discharge status: Home / Self Care | Payer: PRIVATE HEALTH INSURANCE | Source: Ambulatory Visit | Attending: Internal Medicine | Admitting: Internal Medicine

## 2012-09-22 VITALS — BP 106/74 | HR 72 | Temp 98.6°F | Ht 65.5 in | Wt 193.0 lb

## 2012-09-22 DIAGNOSIS — Z0000 Encounter for general adult medical examination without abnormal findings: Secondary | ICD-10-CM

## 2012-09-22 DIAGNOSIS — Z01419 Encounter for gynecological examination (general) (routine) without abnormal findings: Secondary | ICD-10-CM | POA: Insufficient documentation

## 2012-09-22 DIAGNOSIS — Z6836 Body mass index (BMI) 36.0-36.9, adult: Secondary | ICD-10-CM | POA: Insufficient documentation

## 2012-09-22 DIAGNOSIS — Z1151 Encounter for screening for human papillomavirus (HPV): Secondary | ICD-10-CM | POA: Insufficient documentation

## 2012-09-22 NOTE — Patient Instructions (Signed)
Continue lifestyle intervention healthy eating and exercise . Ill reviewe your record about the chest pain and fu  If continuing.  Nutritional and over the counter supplements are considered food by the  FDA by Act of Congress.  Therefore there is no burden of proof that these are safe or effective of any claims  . Few  have little credible rigorous scientific trials that support the desired effect. To be taken off the market persons  harmed have to prove this .  This is what happened with  Ephedra where multiple  people had strokes and vascular events  before it was banned..    Save your money and buy fresh non processed foods that contain the nutrient instead.  This is intrinsically safer. Increasing Omega 3 ( fish) and avoiding excess omega 6 ( red meat) is a healthier diet in all people   .Thus  in healthy people there is no reason to add excess supplements if eating a mediterranean type diet. The only supplements I would  regularly advance would be Calcium/ Vitamin D in those who can't get adequate amounts in their diet .   Some medical conditions/ situations could  require some specific supplementation..but in general ,healthy food choices are better than a supplement. Food is natural .   Things in a bottle are not.   For weight loss control track sleep food and beverage and activity level with pedometer or other  Sleep deprivation  is associated with  Weight gain  And increase carb intake.   Consider weight watchers type program    Preventive Care for Adults, Female A healthy lifestyle and preventive care can promote health and wellness. Preventive health guidelines for women include the following key practices.  A routine yearly physical is a good way to check with your caregiver about your health and preventive screening. It is a chance to share any concerns and updates on your health, and to receive a thorough exam.  Visit your dentist for a routine exam and preventive care every 6  months. Brush your teeth twice a day and floss once a day. Good oral hygiene prevents tooth decay and gum disease.  The frequency of eye exams is based on your age, health, family medical history, use of contact lenses, and other factors. Follow your caregiver's recommendations for frequency of eye exams.  Eat a healthy diet. Foods like vegetables, fruits, whole grains, low-fat dairy products, and lean protein foods contain the nutrients you need without too many calories. Decrease your intake of foods high in solid fats, added sugars, and salt. Eat the right amount of calories for you.Get information about a proper diet from your caregiver, if necessary.  Regular physical exercise is one of the most important things you can do for your health. Most adults should get at least 150 minutes of moderate-intensity exercise (any activity that increases your heart rate and causes you to sweat) each week. In addition, most adults need muscle-strengthening exercises on 2 or more days a week.  Maintain a healthy weight. The body mass index (BMI) is a screening tool to identify possible weight problems. It provides an estimate of body fat based on height and weight. Your caregiver can help determine your BMI, and can help you achieve or maintain a healthy weight.For adults 20 years and older:  A BMI below 18.5 is considered underweight.  A BMI of 18.5 to 24.9 is normal.  A BMI of 25 to 29.9 is considered overweight.  A BMI of  30 and above is considered obese.  Maintain normal blood lipids and cholesterol levels by exercising and minimizing your intake of saturated fat. Eat a balanced diet with plenty of fruit and vegetables. Blood tests for lipids and cholesterol should begin at age 26 and be repeated every 5 years. If your lipid or cholesterol levels are high, you are over 50, or you are at high risk for heart disease, you may need your cholesterol levels checked more frequently.Ongoing high lipid and  cholesterol levels should be treated with medicines if diet and exercise are not effective.  If you smoke, find out from your caregiver how to quit. If you do not use tobacco, do not start.  If you are pregnant, do not drink alcohol. If you are breastfeeding, be very cautious about drinking alcohol. If you are not pregnant and choose to drink alcohol, do not exceed 1 drink per day. One drink is considered to be 12 ounces (355 mL) of beer, 5 ounces (148 mL) of wine, or 1.5 ounces (44 mL) of liquor.  Avoid use of street drugs. Do not share needles with anyone. Ask for help if you need support or instructions about stopping the use of drugs.  High blood pressure causes heart disease and increases the risk of stroke. Your blood pressure should be checked at least every 1 to 2 years. Ongoing high blood pressure should be treated with medicines if weight loss and exercise are not effective.  If you are 69 to 42 years old, ask your caregiver if you should take aspirin to prevent strokes.  Diabetes screening involves taking a blood sample to check your fasting blood sugar level. This should be done once every 3 years, after age 65, if you are within normal weight and without risk factors for diabetes. Testing should be considered at a younger age or be carried out more frequently if you are overweight and have at least 1 risk factor for diabetes.  Breast cancer screening is essential preventive care for women. You should practice "breast self-awareness." This means understanding the normal appearance and feel of your breasts and may include breast self-examination. Any changes detected, no matter how small, should be reported to a caregiver. Women in their 92s and 30s should have a clinical breast exam (CBE) by a caregiver as part of a regular health exam every 1 to 3 years. After age 36, women should have a CBE every year. Starting at age 52, women should consider having a mammography (breast X-ray test)  every year. Women who have a family history of breast cancer should talk to their caregiver about genetic screening. Women at a high risk of breast cancer should talk to their caregivers about having magnetic resonance imaging (MRI) and a mammography every year.  The Pap test is a screening test for cervical cancer. A Pap test can show cell changes on the cervix that might become cervical cancer if left untreated. A Pap test is a procedure in which cells are obtained and examined from the lower end of the uterus (cervix).  Women should have a Pap test starting at age 67.  Between ages 26 and 50, Pap tests should be repeated every 2 years.  Beginning at age 8, you should have a Pap test every 3 years as long as the past 3 Pap tests have been normal.  Some women have medical problems that increase the chance of getting cervical cancer. Talk to your caregiver about these problems. It is especially important to  talk to your caregiver if a new problem develops soon after your last Pap test. In these cases, your caregiver may recommend more frequent screening and Pap tests.  The above recommendations are the same for women who have or have not gotten the vaccine for human papillomavirus (HPV).  If you had a hysterectomy for a problem that was not cancer or a condition that could lead to cancer, then you no longer need Pap tests. Even if you no longer need a Pap test, a regular exam is a good idea to make sure no other problems are starting.  If you are between ages 47 and 48, and you have had normal Pap tests going back 10 years, you no longer need Pap tests. Even if you no longer need a Pap test, a regular exam is a good idea to make sure no other problems are starting.  If you have had past treatment for cervical cancer or a condition that could lead to cancer, you need Pap tests and screening for cancer for at least 20 years after your treatment.  If Pap tests have been discontinued, risk factors  (such as a new sexual partner) need to be reassessed to determine if screening should be resumed.  The HPV test is an additional test that may be used for cervical cancer screening. The HPV test looks for the virus that can cause the cell changes on the cervix. The cells collected during the Pap test can be tested for HPV. The HPV test could be used to screen women aged 57 years and older, and should be used in women of any age who have unclear Pap test results. After the age of 22, women should have HPV testing at the same frequency as a Pap test.  Colorectal cancer can be detected and often prevented. Most routine colorectal cancer screening begins at the age of 26 and continues through age 21. However, your caregiver may recommend screening at an earlier age if you have risk factors for colon cancer. On a yearly basis, your caregiver may provide home test kits to check for hidden blood in the stool. Use of a small camera at the end of a tube, to directly examine the colon (sigmoidoscopy or colonoscopy), can detect the earliest forms of colorectal cancer. Talk to your caregiver about this at age 42, when routine screening begins. Direct examination of the colon should be repeated every 5 to 10 years through age 48, unless early forms of pre-cancerous polyps or small growths are found.  Hepatitis C blood testing is recommended for all people born from [birth year] through 1965 and any individual with known risks for hepatitis C.  Practice safe sex. Use condoms and avoid high-risk sexual practices to reduce the spread of sexually transmitted infections (STIs). STIs include gonorrhea, chlamydia, syphilis, trichomonas, herpes, HPV, and human immunodeficiency virus (HIV). Herpes, HIV, and HPV are viral illnesses that have no cure. They can result in disability, cancer, and death. Sexually active women aged 42 and younger should be checked for chlamydia. Older women with new or multiple partners should also be tested  for chlamydia. Testing for other STIs is recommended if you are sexually active and at increased risk.  Osteoporosis is a disease in which the bones lose minerals and strength with aging. This can result in serious bone fractures. The risk of osteoporosis can be identified using a bone density scan. Women ages 84 and over and women at risk for fractures or osteoporosis should discuss screening with  their caregivers. Ask your caregiver whether you should take a calcium supplement or vitamin D to reduce the rate of osteoporosis.  Menopause can be associated with physical symptoms and risks. Hormone replacement therapy is available to decrease symptoms and risks. You should talk to your caregiver about whether hormone replacement therapy is right for you.  Use sunscreen with sun protection factor (SPF) of 30 or more. Apply sunscreen liberally and repeatedly throughout the day. You should seek shade when your shadow is shorter than you. Protect yourself by wearing long sleeves, pants, a wide-brimmed hat, and sunglasses year round, whenever you are outdoors.  Once a month, do a whole body skin exam, using a mirror to look at the skin on your back. Notify your caregiver of new moles, moles that have irregular borders, moles that are larger than a pencil eraser, or moles that have changed in shape or color.  Stay current with required immunizations.  Influenza. You need a dose every fall (or winter). The composition of the flu vaccine changes each year, so being vaccinated once is not enough.  Pneumococcal polysaccharide. You need 1 to 2 doses if you smoke cigarettes or if you have certain chronic medical conditions. You need 1 dose at age 49 (or older) if you have never been vaccinated.  Tetanus, diphtheria, pertussis (Tdap, Td). Get 1 dose of Tdap vaccine if you are younger than age 80, are over 75 and have contact with an infant, are a Research scientist (physical sciences), are pregnant, or simply want to be protected from  whooping cough. After that, you need a Td booster dose every 10 years. Consult your caregiver if you have not had at least 3 tetanus and diphtheria-containing shots sometime in your life or have a deep or dirty wound.  HPV. You need this vaccine if you are a woman age 78 or younger. The vaccine is given in 3 doses over 6 months.  Measles, mumps, rubella (MMR). You need at least 1 dose of MMR if you were born in 1957 or later. You may also need a second dose.  Meningococcal. If you are age 77 to 21 and a first-year college student living in a residence hall, or have one of several medical conditions, you need to get vaccinated against meningococcal disease. You may also need additional booster doses.  Zoster (shingles). If you are age 70 or older, you should get this vaccine.  Varicella (chickenpox). If you have never had chickenpox or you were vaccinated but received only 1 dose, talk to your caregiver to find out if you need this vaccine.  Hepatitis A. You need this vaccine if you have a specific risk factor for hepatitis A virus infection or you simply wish to be protected from this disease. The vaccine is usually given as 2 doses, 6 to 18 months apart.  Hepatitis B. You need this vaccine if you have a specific risk factor for hepatitis B virus infection or you simply wish to be protected from this disease. The vaccine is given in 3 doses, usually over 6 months. Preventive Services / Frequency Ages 71 to 53  Blood pressure check.** / Every 1 to 2 years.  Lipid and cholesterol check.** / Every 5 years beginning at age 10.  Clinical breast exam.** / Every 3 years for women in their 57s and 30s.  Pap test.** / Every 2 years from ages 21 through 22. Every 3 years starting at age 55 through age 29 or 34 with a history of 3 consecutive  normal Pap tests.  HPV screening.** / Every 3 years from ages 31 through ages 22 to 47 with a history of 3 consecutive normal Pap tests.  Hepatitis C blood  test.** / For any individual with known risks for hepatitis C.  Skin self-exam. / Monthly.  Influenza immunization.** / Every year.  Pneumococcal polysaccharide immunization.** / 1 to 2 doses if you smoke cigarettes or if you have certain chronic medical conditions.  Tetanus, diphtheria, pertussis (Tdap, Td) immunization. / A one-time dose of Tdap vaccine. After that, you need a Td booster dose every 10 years.  HPV immunization. / 3 doses over 6 months, if you are 2 and younger.  Measles, mumps, rubella (MMR) immunization. / You need at least 1 dose of MMR if you were born in 1957 or later. You may also need a second dose.  Meningococcal immunization. / 1 dose if you are age 27 to 107 and a first-year college student living in a residence hall, or have one of several medical conditions, you need to get vaccinated against meningococcal disease. You may also need additional booster doses.  Varicella immunization.** / Consult your caregiver.  Hepatitis A immunization.** / Consult your caregiver. 2 doses, 6 to 18 months apart.  Hepatitis B immunization.** / Consult your caregiver. 3 doses usually over 6 months. Ages 80 to 60  Blood pressure check.** / Every 1 to 2 years.  Lipid and cholesterol check.** / Every 5 years beginning at age 69.  Clinical breast exam.** / Every year after age 83.  Mammogram.** / Every year beginning at age 20 and continuing for as long as you are in good health. Consult with your caregiver.  Pap test.** / Every 3 years starting at age 65 through age 96 or 78 with a history of 3 consecutive normal Pap tests.  HPV screening.** / Every 3 years from ages 45 through ages 93 to 31 with a history of 3 consecutive normal Pap tests.  Fecal occult blood test (FOBT) of stool. / Every year beginning at age 41 and continuing until age 69. You may not need to do this test if you get a colonoscopy every 10 years.  Flexible sigmoidoscopy or colonoscopy.** / Every 5 years  for a flexible sigmoidoscopy or every 10 years for a colonoscopy beginning at age 18 and continuing until age 11.  Hepatitis C blood test.** / For all people born from [birth year] through 1965 and any individual with known risks for hepatitis C.  Skin self-exam. / Monthly.  Influenza immunization.** / Every year.  Pneumococcal polysaccharide immunization.** / 1 to 2 doses if you smoke cigarettes or if you have certain chronic medical conditions.  Tetanus, diphtheria, pertussis (Tdap, Td) immunization.** / A one-time dose of Tdap vaccine. After that, you need a Td booster dose every 10 years.  Measles, mumps, rubella (MMR) immunization. / You need at least 1 dose of MMR if you were born in 1957 or later. You may also need a second dose.  Varicella immunization.** / Consult your caregiver.  Meningococcal immunization.** / Consult your caregiver.  Hepatitis A immunization.** / Consult your caregiver. 2 doses, 6 to 18 months apart.  Hepatitis B immunization.** / Consult your caregiver. 3 doses, usually over 6 months. Ages 65 and over  Blood pressure check.** / Every 1 to 2 years.  Lipid and cholesterol check.** / Every 5 years beginning at age 71.  Clinical breast exam.** / Every year after age 81.  Mammogram.** / Every year beginning at age  40 and continuing for as long as you are in good health. Consult with your caregiver.  Pap test.** / Every 3 years starting at age 36 through age 42 or 8 with a 3 consecutive normal Pap tests. Testing can be stopped between 65 and 70 with 3 consecutive normal Pap tests and no abnormal Pap or HPV tests in the past 10 years.  HPV screening.** / Every 3 years from ages 7 through ages 69 or 81 with a history of 3 consecutive normal Pap tests. Testing can be stopped between 65 and 70 with 3 consecutive normal Pap tests and no abnormal Pap or HPV tests in the past 10 years.  Fecal occult blood test (FOBT) of stool. / Every year beginning at age 59 and  continuing until age 56. You may not need to do this test if you get a colonoscopy every 10 years.  Flexible sigmoidoscopy or colonoscopy.** / Every 5 years for a flexible sigmoidoscopy or every 10 years for a colonoscopy beginning at age 14 and continuing until age 103.  Hepatitis C blood test.** / For all people born from [birth year] through 1965 and any individual with known risks for hepatitis C.  Osteoporosis screening.** / A one-time screening for women ages 28 and over and women at risk for fractures or osteoporosis.  Skin self-exam. / Monthly.  Influenza immunization.** / Every year.  Pneumococcal polysaccharide immunization.** / 1 dose at age 15 (or older) if you have never been vaccinated.  Tetanus, diphtheria, pertussis (Tdap, Td) immunization. / A one-time dose of Tdap vaccine if you are over 65 and have contact with an infant, are a Research scientist (physical sciences), or simply want to be protected from whooping cough. After that, you need a Td booster dose every 10 years.  Varicella immunization.** / Consult your caregiver.  Meningococcal immunization.** / Consult your caregiver.  Hepatitis A immunization.** / Consult your caregiver. 2 doses, 6 to 18 months apart.  Hepatitis B immunization.** / Check with your caregiver. 3 doses, usually over 6 months. ** Family history and personal history of risk and conditions may change your caregiver's recommendations. Document Released: 07/15/2001 Document Revised: 08/11/2011 Document Reviewed: 10/14/2010 [facility] Patient Information 2013 [place], [place].

## 2012-09-22 NOTE — Progress Notes (Signed)
Chief Complaint  Patient presents with  . Annual Exam    Has a white filmy substance on her lips.    HPI: Patient comes in today for Preventive Health Care visit  Last ov was 2012   With pcp     Since that time she has done pretty well ;now employed but was  seen in ed 2 11 for chest pain  Felt to be some type of scar tissue  No cv cause  Last wellness with me 12 11  Period  Regular  Btl.   Last pap at [facility] ? When no insurance  Knees ache  Anterior at times  Has to squat at work with samll children Weight problem little sleep asks about supplement.  Asks about phentermine  Sleep if dec busy not a lot of time for exercise and eating healthy  Snacking a problem   ROS:  GEN/ HEENT: No fever, significant weight changes sweats headaches vision problems hearing changes, CV/ PULM; No chest pain shortness of breath cough, syncope,edema  change in exercise tolerance. GI /GU: No adominal pain, vomiting, change in bowel habits. No blood in the stool. No significant GU symptoms. SKIN/HEME: ,no acute skin rashes suspicious lesions or bleeding. No lymphadenopathy, nodules, masses.  NEURO/ PSYCH:  No neurologic signs such as weakness numbness. No depression anxiety. IMM/ Allergy: No unusual infections.  Allergy .   REST of 12 system review negative except as per HPI   Past Medical History  Diagnosis Date  . Allergic rhinitis     History of allergy testing positive  . Asthma   . Recurrent pregnancy loss   . Tinnitus, right     Had mri  and eustachian tube dysfunction   Past Surgical History  Procedure Laterality Date  . Cesarean section    . Breast reduction surgery    . Tympanostomy tube placement  2009    right       History reviewed. No pertinent family history.  History   Social History  . Marital Status: Married    Spouse Name: N/A    Number of Children: N/A  . Years of Education: N/A   Social History Main Topics  . Smoking status: Never Smoker   . Smokeless tobacco: None  .  Alcohol Use: Yes     Comment: occ.  . Drug Use: No  . Sexually Active:    Other Topics Concern  . None   Social History Narrative   Separated husband in [place]  Coming back so far ,    Son with autism  One at [employer] one at  [employer] 9th grade.    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       Husband not yet working.  Back up from [place]                 Outpatient Encounter Prescriptions as of 09/22/2012  Medication Sig Dispense Refill  . cetirizine (ZYRTEC) 10 MG tablet Take 10 mg by mouth daily.      Marland Kitchen GARCINIA CAMBOGIA-CHROMIUM PO Take 1 capsule by mouth 3 (three) times daily as needed (weight control).      . naproxen (NAPROSYN) 375 MG tablet Take 1  tablet (375 mg total) by mouth 2 (two) times daily.  20 tablet  0  . [DISCONTINUED] esomeprazole (NEXIUM) 40 MG capsule Take 1 capsule (40 mg total) by mouth daily.  30 capsule  0   No facility-administered encounter medications on file as of 09/22/2012.    EXAM:  BP 106/74  Pulse 72  Temp(Src) 98.6 F (37 C) (Oral)  Ht 5' 5.5" (1.664 m)  Wt 193 lb (87.544 kg)  BMI 31.62 kg/m2  SpO2 98%  LMP 08/31/2012  Body mass index is 31.62 kg/(m^2).  Physical Exam: Vital signs reviewed UJW:[id number] is a well-developed well-nourished alert cooperative   female who appears her stated age in no acute distress.  HEENT: normocephalic atraumatic , Eyes: PERRL EOM's full, conjunctiva clear, Nares: paten,t no deformity discharge or tenderness., Ears: no deformity EAC's clear TMs with normal landmarks. Left  Right with  irreg clear bubbles  Mouth: clear OP, no lesions, edema.  Moist mucous membranes. Dentition in adequate repair. NECK: supple without masses, thyromegaly or bruits. CHEST/PULM:  Clear to auscultation and percussion breath sounds  equal no wheeze , rales or rhonchi. No chest wall deformities or tenderness. Breast: normal by inspection . No dimpling, discharge, masses, tenderness or discharge . well healed scar reduction mammoplasty CV: PMI is nondisplaced, S1 S2 no gallops, murmurs, rubs. Peripheral pulses are full without delay.No JVD .  ABDOMEN: Bowel sounds normal nontender  No guard or rebound, no hepato splenomegal no CVA tenderness.  No hernia. Extremtities:  No clubbing cyanosis or edema, no acute joint swelling or redness no focal atrophy NEURO:  Oriented x3, cranial nerves 3-12 appear to be intact, no obvious focal weakness,gait within normal limits no abnormal reflexes or asymmetrical SKIN: No acute rashes normal turgor, color, no bruising or petechiae. PSYCH: Oriented, good eye contact, no obvious depression anxiety, cognition and judgment appear normal. LN: no cervical axillary inguinal adenopathy Pelvic: NL ext GU, labia clear without lesions or rash . Vagina no lesions .Cervix: clear  UTERUS: Neg CMT Adnexa:  clear no masses . PAP done   Lab Results  Component Value Date   WBC 7.5 09/15/2012   HGB 11.9* 09/15/2012   HCT 36.0 09/15/2012   PLT 280.0 09/15/2012   GLUCOSE 93 09/15/2012   CHOL 195 09/15/2012   TRIG 114.0 09/15/2012   HDL 47.80 09/15/2012   LDLCALC 124* 09/15/2012   ALT 16 09/15/2012   AST 20 09/15/2012   NA 140 09/15/2012   K 4.0 09/15/2012   CL 108 09/15/2012   CREATININE 0.8 09/15/2012   BUN 9 09/15/2012   CO2 23 09/15/2012   TSH 0.99 09/15/2012    ASSESSMENT AND PLAN:  Discussed the following assessment and plan:  Routine general medical examination at a health care facility - Plan: PAP [[place]]  Encounter for routine gynecological examination - Plan: PAP [[place]]  BMI 36.0-36.9,adult - counseled   Patient Care Team: Madelin Headings, MD as PCP - General Drema Halon, MD as Attending Physician (Otolaryngology) Patient Instructions  Continue lifestyle intervention  healthy eating and exercise . Ill reviewe your record about the chest pain and fu  If continuing.  Nutritional and over the counter supplements are considered food by the  FDA by Act of Congress.  Therefore there is no burden of proof that these are safe or effective of any claims  . Few  have little credible rigorous scientific trials that support the desired effect. To be taken off the market persons  harmed  have to prove this .  This is what happened with  Ephedra where multiple  people had strokes and vascular events  before it was banned..    Save your money and buy fresh non processed foods that contain the nutrient instead.  This is intrinsically safer. Increasing Omega 3 ( fish) and avoiding excess omega 6 ( red meat) is a healthier diet in all people   .Thus  in healthy people there is no reason to add excess supplements if eating a mediterranean type diet. The only supplements I would  regularly advance would be Calcium/ Vitamin D in those who can't get adequate amounts in their diet .   Some medical conditions/ situations could  require some specific supplementation..but in general ,healthy food choices are better than a supplement. Food is natural .   Things in a bottle are not.   For weight loss control track sleep food and beverage and activity level with pedometer or other  Sleep deprivation  is associated with  Weight gain  And increase carb intake.   Consider weight watchers type program    Preventive Care for Adults, Female A healthy lifestyle and preventive care can promote health and wellness. Preventive health guidelines for women include the following key practices.  A routine yearly physical is a good way to check with your caregiver about your health and preventive screening. It is a chance to share any concerns and updates on your health, and to receive a thorough exam.  Visit your dentist for a routine exam and preventive care every 6 months. Brush your teeth twice a day  and floss once a day. Good oral hygiene prevents tooth decay and gum disease.  The frequency of eye exams is based on your age, health, family medical history, use of contact lenses, and other factors. Follow your caregiver's recommendations for frequency of eye exams.  Eat a healthy diet. Foods like vegetables, fruits, whole grains, low-fat dairy products, and lean protein foods contain the nutrients you need without too many calories. Decrease your intake of foods high in solid fats, added sugars, and salt. Eat the right amount of calories for you.Get information about a proper diet from your caregiver, if necessary.  Regular physical exercise is one of the most important things you can do for your health. Most adults should get at least 150 minutes of moderate-intensity exercise (any activity that increases your heart rate and causes you to sweat) each week. In addition, most adults need muscle-strengthening exercises on 2 or more days a week.  Maintain a healthy weight. The body mass index (BMI) is a screening tool to identify possible weight problems. It provides an estimate of body fat based on height and weight. Your caregiver can help determine your BMI, and can help you achieve or maintain a healthy weight.For adults 20 years and older:  A BMI below 18.5 is considered underweight.  A BMI of 18.5 to 24.9 is normal.  A BMI of 25 to 29.9 is considered overweight.  A BMI of 30 and above is considered obese.  Maintain normal blood lipids and cholesterol levels by exercising and minimizing your intake of saturated fat. Eat a balanced diet with plenty of fruit and vegetables. Blood tests for lipids and cholesterol should begin at age 64 and be repeated every 5 years. If your lipid or cholesterol levels are high, you are over 50, or you are at high risk for heart disease, you may need your cholesterol levels checked more frequently.Ongoing  high lipid and cholesterol levels should be treated  with medicines if diet and exercise are not effective.  If you smoke, find out from your caregiver how to quit. If you do not use tobacco, do not start.  If you are pregnant, do not drink alcohol. If you are breastfeeding, be very cautious about drinking alcohol. If you are not pregnant and choose to drink alcohol, do not exceed 1 drink per day. One drink is considered to be 12 ounces (355 mL) of beer, 5 ounces (148 mL) of wine, or 1.5 ounces (44 mL) of liquor.  Avoid use of street drugs. Do not share needles with anyone. Ask for help if you need support or instructions about stopping the use of drugs.  High blood pressure causes heart disease and increases the risk of stroke. Your blood pressure should be checked at least every 1 to 2 years. Ongoing high blood pressure should be treated with medicines if weight loss and exercise are not effective.  If you are 42 to 42 years old, ask your caregiver if you should take aspirin to prevent strokes.  Diabetes screening involves taking a blood sample to check your fasting blood sugar level. This should be done once every 3 years, after age 41, if you are within normal weight and without risk factors for diabetes. Testing should be considered at a younger age or be carried out more frequently if you are overweight and have at least 1 risk factor for diabetes.  Breast cancer screening is essential preventive care for women. You should practice "breast self-awareness." This means understanding the normal appearance and feel of your breasts and may include breast self-examination. Any changes detected, no matter how small, should be reported to a caregiver. Women in their 30s and 30s should have a clinical breast exam (CBE) by a caregiver as part of a regular health exam every 1 to 3 years. After age 56, women should have a CBE every year. Starting at age 54, women should consider having a mammography (breast X-ray test) every year. Women who have a family  history of breast cancer should talk to their caregiver about genetic screening. Women at a high risk of breast cancer should talk to their caregivers about having magnetic resonance imaging (MRI) and a mammography every year.  The Pap test is a screening test for cervical cancer. A Pap test can show cell changes on the cervix that might become cervical cancer if left untreated. A Pap test is a procedure in which cells are obtained and examined from the lower end of the uterus (cervix).  Women should have a Pap test starting at age 7.  Between ages 7 and 72, Pap tests should be repeated every 2 years.  Beginning at age 37, you should have a Pap test every 3 years as long as the past 3 Pap tests have been normal.  Some women have medical problems that increase the chance of getting cervical cancer. Talk to your caregiver about these problems. It is especially important to talk to your caregiver if a new problem develops soon after your last Pap test. In these cases, your caregiver may recommend more frequent screening and Pap tests.  The above recommendations are the same for women who have or have not gotten the vaccine for human papillomavirus (HPV).  If you had a hysterectomy for a problem that was not cancer or a condition that could lead to cancer, then you no longer need Pap tests. Even if you  no longer need a Pap test, a regular exam is a good idea to make sure no other problems are starting.  If you are between ages 70 and 90, and you have had normal Pap tests going back 10 years, you no longer need Pap tests. Even if you no longer need a Pap test, a regular exam is a good idea to make sure no other problems are starting.  If you have had past treatment for cervical cancer or a condition that could lead to cancer, you need Pap tests and screening for cancer for at least 20 years after your treatment.  If Pap tests have been discontinued, risk factors (such as a new sexual partner) need  to be reassessed to determine if screening should be resumed.  The HPV test is an additional test that may be used for cervical cancer screening. The HPV test looks for the virus that can cause the cell changes on the cervix. The cells collected during the Pap test can be tested for HPV. The HPV test could be used to screen women aged 20 years and older, and should be used in women of any age who have unclear Pap test results. After the age of 73, women should have HPV testing at the same frequency as a Pap test.  Colorectal cancer can be detected and often prevented. Most routine colorectal cancer screening begins at the age of 75 and continues through age 84. However, your caregiver may recommend screening at an earlier age if you have risk factors for colon cancer. On a yearly basis, your caregiver may provide home test kits to check for hidden blood in the stool. Use of a small camera at the end of a tube, to directly examine the colon (sigmoidoscopy or colonoscopy), can detect the earliest forms of colorectal cancer. Talk to your caregiver about this at age 22, when routine screening begins. Direct examination of the colon should be repeated every 5 to 10 years through age 24, unless early forms of pre-cancerous polyps or small growths are found.  Hepatitis C blood testing is recommended for all people born from [birth year] through 1965 and any individual with known risks for hepatitis C.  Practice safe sex. Use condoms and avoid high-risk sexual practices to reduce the spread of sexually transmitted infections (STIs). STIs include gonorrhea, chlamydia, syphilis, trichomonas, herpes, HPV, and human immunodeficiency virus (HIV). Herpes, HIV, and HPV are viral illnesses that have no cure. They can result in disability, cancer, and death. Sexually active women aged 62 and younger should be checked for chlamydia. Older women with new or multiple partners should also be tested for chlamydia. Testing for other STIs  is recommended if you are sexually active and at increased risk.  Osteoporosis is a disease in which the bones lose minerals and strength with aging. This can result in serious bone fractures. The risk of osteoporosis can be identified using a bone density scan. Women ages 36 and over and women at risk for fractures or osteoporosis should discuss screening with their caregivers. Ask your caregiver whether you should take a calcium supplement or vitamin D to reduce the rate of osteoporosis.  Menopause can be associated with physical symptoms and risks. Hormone replacement therapy is available to decrease symptoms and risks. You should talk to your caregiver about whether hormone replacement therapy is right for you.  Use sunscreen with sun protection factor (SPF) of 30 or more. Apply sunscreen liberally and repeatedly throughout the day. You should seek shade  when your shadow is shorter than you. Protect yourself by wearing long sleeves, pants, a wide-brimmed hat, and sunglasses year round, whenever you are outdoors.  Once a month, do a whole body skin exam, using a mirror to look at the skin on your back. Notify your caregiver of new moles, moles that have irregular borders, moles that are larger than a pencil eraser, or moles that have changed in shape or color.  Stay current with required immunizations.  Influenza. You need a dose every fall (or winter). The composition of the flu vaccine changes each year, so being vaccinated once is not enough.  Pneumococcal polysaccharide. You need 1 to 2 doses if you smoke cigarettes or if you have certain chronic medical conditions. You need 1 dose at age 53 (or older) if you have never been vaccinated.  Tetanus, diphtheria, pertussis (Tdap, Td). Get 1 dose of Tdap vaccine if you are younger than age 28, are over 39 and have contact with an infant, are a Research scientist (physical sciences), are pregnant, or simply want to be protected from whooping cough. After that, you need a  Td booster dose every 10 years. Consult your caregiver if you have not had at least 3 tetanus and diphtheria-containing shots sometime in your life or have a deep or dirty wound.  HPV. You need this vaccine if you are a woman age 80 or younger. The vaccine is given in 3 doses over 6 months.  Measles, mumps, rubella (MMR). You need at least 1 dose of MMR if you were born in 1957 or later. You may also need a second dose.  Meningococcal. If you are age 51 to 29 and a first-year college student living in a residence hall, or have one of several medical conditions, you need to get vaccinated against meningococcal disease. You may also need additional booster doses.  Zoster (shingles). If you are age 15 or older, you should get this vaccine.  Varicella (chickenpox). If you have never had chickenpox or you were vaccinated but received only 1 dose, talk to your caregiver to find out if you need this vaccine.  Hepatitis A. You need this vaccine if you have a specific risk factor for hepatitis A virus infection or you simply wish to be protected from this disease. The vaccine is usually given as 2 doses, 6 to 18 months apart.  Hepatitis B. You need this vaccine if you have a specific risk factor for hepatitis B virus infection or you simply wish to be protected from this disease. The vaccine is given in 3 doses, usually over 6 months. Preventive Services / Frequency Ages 24 to 30  Blood pressure check.** / Every 1 to 2 years.  Lipid and cholesterol check.** / Every 5 years beginning at age 64.  Clinical breast exam.** / Every 3 years for women in their 64s and 30s.  Pap test.** / Every 2 years from ages 22 through 43. Every 3 years starting at age 20 through age 49 or 1 with a history of 3 consecutive normal Pap tests.  HPV screening.** / Every 3 years from ages 5 through ages 77 to 49 with a history of 3 consecutive normal Pap tests.  Hepatitis C blood test.** / For any individual with known  risks for hepatitis C.  Skin self-exam. / Monthly.  Influenza immunization.** / Every year.  Pneumococcal polysaccharide immunization.** / 1 to 2 doses if you smoke cigarettes or if you have certain chronic medical conditions.  Tetanus, diphtheria, pertussis (Tdap,  Td) immunization. / A one-time dose of Tdap vaccine. After that, you need a Td booster dose every 10 years.  HPV immunization. / 3 doses over 6 months, if you are 40 and younger.  Measles, mumps, rubella (MMR) immunization. / You need at least 1 dose of MMR if you were born in 1957 or later. You may also need a second dose.  Meningococcal immunization. / 1 dose if you are age 12 to 60 and a first-year college student living in a residence hall, or have one of several medical conditions, you need to get vaccinated against meningococcal disease. You may also need additional booster doses.  Varicella immunization.** / Consult your caregiver.  Hepatitis A immunization.** / Consult your caregiver. 2 doses, 6 to 18 months apart.  Hepatitis B immunization.** / Consult your caregiver. 3 doses usually over 6 months. Ages 60 to 68  Blood pressure check.** / Every 1 to 2 years.  Lipid and cholesterol check.** / Every 5 years beginning at age 50.  Clinical breast exam.** / Every year after age 31.  Mammogram.** / Every year beginning at age 43 and continuing for as long as you are in good health. Consult with your caregiver.  Pap test.** / Every 3 years starting at age 67 through age 48 or 29 with a history of 3 consecutive normal Pap tests.  HPV screening.** / Every 3 years from ages 37 through ages 57 to 70 with a history of 3 consecutive normal Pap tests.  Fecal occult blood test (FOBT) of stool. / Every year beginning at age 24 and continuing until age 51. You may not need to do this test if you get a colonoscopy every 10 years.  Flexible sigmoidoscopy or colonoscopy.** / Every 5 years for a flexible sigmoidoscopy or every 10  years for a colonoscopy beginning at age 32 and continuing until age 59.  Hepatitis C blood test.** / For all people born from [birth year] through 1965 and any individual with known risks for hepatitis C.  Skin self-exam. / Monthly.  Influenza immunization.** / Every year.  Pneumococcal polysaccharide immunization.** / 1 to 2 doses if you smoke cigarettes or if you have certain chronic medical conditions.  Tetanus, diphtheria, pertussis (Tdap, Td) immunization.** / A one-time dose of Tdap vaccine. After that, you need a Td booster dose every 10 years.  Measles, mumps, rubella (MMR) immunization. / You need at least 1 dose of MMR if you were born in 1957 or later. You may also need a second dose.  Varicella immunization.** / Consult your caregiver.  Meningococcal immunization.** / Consult your caregiver.  Hepatitis A immunization.** / Consult your caregiver. 2 doses, 6 to 18 months apart.  Hepatitis B immunization.** / Consult your caregiver. 3 doses, usually over 6 months. Ages 25 and over  Blood pressure check.** / Every 1 to 2 years.  Lipid and cholesterol check.** / Every 5 years beginning at age 60.  Clinical breast exam.** / Every year after age 67.  Mammogram.** / Every year beginning at age 39 and continuing for as long as you are in good health. Consult with your caregiver.  Pap test.** / Every 3 years starting at age 15 through age 49 or 2 with a 3 consecutive normal Pap tests. Testing can be stopped between 65 and 70 with 3 consecutive normal Pap tests and no abnormal Pap or HPV tests in the past 10 years.  HPV screening.** / Every 3 years from ages 51 through ages 60 or 24  with a history of 3 consecutive normal Pap tests. Testing can be stopped between 65 and 70 with 3 consecutive normal Pap tests and no abnormal Pap or HPV tests in the past 10 years.  Fecal occult blood test (FOBT) of stool. / Every year beginning at age 43 and continuing until age 65. You may not need to do  this test if you get a colonoscopy every 10 years.  Flexible sigmoidoscopy or colonoscopy.** / Every 5 years for a flexible sigmoidoscopy or every 10 years for a colonoscopy beginning at age 18 and continuing until age 73.  Hepatitis C blood test.** / For all people born from [birth year] through 1965 and any individual with known risks for hepatitis C.  Osteoporosis screening.** / A one-time screening for women ages 57 and over and women at risk for fractures or osteoporosis.  Skin self-exam. / Monthly.  Influenza immunization.** / Every year.  Pneumococcal polysaccharide immunization.** / 1 dose at age 87 (or older) if you have never been vaccinated.  Tetanus, diphtheria, pertussis (Tdap, Td) immunization. / A one-time dose of Tdap vaccine if you are over 65 and have contact with an infant, are a Research scientist (physical sciences), or simply want to be protected from whooping cough. After that, you need a Td booster dose every 10 years.  Varicella immunization.** / Consult your caregiver.  Meningococcal immunization.** / Consult your caregiver.  Hepatitis A immunization.** / Consult your caregiver. 2 doses, 6 to 18 months apart.  Hepatitis B immunization.** / Check with your caregiver. 3 doses, usually over 6 months. ** Family history and personal history of risk and conditions may change your caregiver's recommendations. Document Released: 07/15/2001 Document Revised: 08/11/2011 Document Reviewed: 10/14/2010 [facility] Patient Information 2013 [place], [place].      Neta Mends. Panosh M.D.  Health Maintenance  Topic Date Due  . Influenza Vaccine  01/31/2013  . Pap Smear  07/03/2014  . Tetanus/tdap  06/24/2017   Health Maintenance Review  Had chest ct because of ? Nodule  Negative except small heaptic cysts  And tiny HH  2 14

## 2012-09-29 ENCOUNTER — Telehealth: Payer: Self-pay | Admitting: Internal Medicine

## 2012-09-29 NOTE — Telephone Encounter (Signed)
Caller: Novalynn/Patient; Phone: [phone]; Reason for Call: Patient calling, she has tried the healthy diet and exercise that Dr.  Fabian Sharp encouraged her to do for weight loss and she has only lost 1 or 2 lbs.  Asking if Dr.  Fabian Sharp would consider letting her use Phentermine for 1 week to see if she can get the boost that she needs?   Uses [facility].

## 2012-09-30 NOTE — Telephone Encounter (Signed)
Pt following up on request for help with weight loss.

## 2012-10-06 NOTE — Telephone Encounter (Signed)
I do not see much long term success with phentermine . There is risk of to these meds although it is probably safe in her situation.I dont routinely order this medicine.    That being said   We can try phentermine 30 mg  1 po qd disp 14   for 2 weeks and have OV for fu.

## 2012-10-07 ENCOUNTER — Encounter: Payer: Self-pay | Admitting: Family Medicine

## 2012-10-07 NOTE — Progress Notes (Signed)
Quick Note:  Tell patient PAP is normal. And neg HPV screen ______

## 2012-10-08 ENCOUNTER — Other Ambulatory Visit: Payer: Self-pay | Admitting: Family Medicine

## 2012-10-08 MED ORDER — PHENTERMINE HCL 30 MG PO CAPS: 30.0000 mg | ORAL_CAPSULE | ORAL | Status: DC

## 2012-10-08 NOTE — Telephone Encounter (Signed)
Pt would also like a referral to gastro for a possible hernia.  She stated she seen a physician over the summer but does not remember who or an exact date.  Please advise.  Thanks!!

## 2012-10-11 NOTE — Telephone Encounter (Signed)
What kind of hernia is she talking about GERD?  HH or surgical problem evaluation. ?   Ok to refer togi or whomever she is supposed to see.  (Perhaps she can make her own appt. ?)

## 2012-10-12 NOTE — Telephone Encounter (Signed)
Left message on below listed number for the pt to return my call. 

## 2012-10-13 NOTE — Telephone Encounter (Signed)
Left message on cell informing the pt to call me back.

## 2012-10-15 NOTE — Telephone Encounter (Signed)
I spoke to the pt and informed her that she does not need a referral to GI.  She will call and make an appt.  She did inform me that the hernia she has was behind her "belly button."  She was told this happened because of her c-section.

## 2012-10-22 ENCOUNTER — Encounter: Payer: Self-pay | Admitting: Internal Medicine

## 2012-10-22 ENCOUNTER — Ambulatory Visit (INDEPENDENT_AMBULATORY_CARE_PROVIDER_SITE_OTHER): Payer: PRIVATE HEALTH INSURANCE | Admitting: Internal Medicine

## 2012-10-22 VITALS — BP 116/84 | HR 87 | Temp 98.2°F | Wt 186.0 lb

## 2012-10-22 DIAGNOSIS — Z79899 Other long term (current) drug therapy: Secondary | ICD-10-CM | POA: Insufficient documentation

## 2012-10-22 DIAGNOSIS — H9319 Tinnitus, unspecified ear: Secondary | ICD-10-CM

## 2012-10-22 DIAGNOSIS — J309 Allergic rhinitis, unspecified: Secondary | ICD-10-CM

## 2012-10-22 DIAGNOSIS — Z6831 Body mass index (BMI) 31.0-31.9, adult: Secondary | ICD-10-CM

## 2012-10-22 DIAGNOSIS — H6980 Other specified disorders of Eustachian tube, unspecified ear: Secondary | ICD-10-CM

## 2012-10-22 DIAGNOSIS — H6981 Other specified disorders of Eustachian tube, right ear: Secondary | ICD-10-CM

## 2012-10-22 MED ORDER — FLUTICASONE PROPIONATE 50 MCG/ACT NA SUSP: NASAL | Status: DC

## 2012-10-22 MED ORDER — PHENTERMINE HCL 30 MG PO CAPS: 30.0000 mg | ORAL_CAPSULE | ORAL | Status: DC

## 2012-10-22 NOTE — Progress Notes (Signed)
Chief Complaint  Patient presents with  . Follow-up    Phentermine.  Complains of rt ear pain.    HPI: Fu new medication  Initiation after 2 weeks of phentermine  Largest meal  3-8   Taking meds around 11 pre  Lunch.   Nose has helped decrease appetite  And portion so far   no palpitations.   Ear   Had tubes 2008 and then ringing and saw dr   Ezzard Standing   Antibiotic .   Seems to be bothering her again  Chews gum   Some disc with shew at times  No fever uri sx otherwise taking antihistamine for allergy   Has used incs in past  ROS: See pertinent positives and negatives per HPI.  Past Medical History  Diagnosis Date  . Allergic rhinitis     History of allergy testing positive  . Asthma   . Recurrent pregnancy loss   . Tinnitus, right     Had mri  and eustachian tube dysfunction    History reviewed. No pertinent family history.  History   Social History  . Marital Status: Married    Spouse Name: N/A    Number of Children: N/A  . Years of Education: N/A   Social History Main Topics  . Smoking status: Never Smoker   . Smokeless tobacco: None  . Alcohol Use: Yes     Comment: occ.  . Drug Use: No  . Sexually Active:    Other Topics Concern  . None   Social History Narrative   Separated husband in [place]  Coming back so far ,    Son with autism  One at [place] one at  [employer] 9th grade.    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       Husband not yet working.  Back up from [place]                 Outpatient Encounter Prescriptions as of 10/22/2012  Medication Sig Dispense Refill  . cetirizine (ZYRTEC) 10 MG tablet Take 10 mg by mouth daily.      . naproxen (NAPROSYN) 375 MG tablet Take 1 tablet (375 mg total) by mouth 2 (two) times daily.  20 tablet  0  .  phentermine 30 MG capsule Take 1 capsule (30 mg total) by mouth every morning.  30 capsule  0  . [DISCONTINUED] phentermine 30 MG capsule Take 1 capsule (30 mg total) by mouth every morning.  14 capsule  0  . fluticasone (FLONASE) 50 MCG/ACT nasal spray 2 spray each nostril qd  16 g  3  . [DISCONTINUED] GARCINIA CAMBOGIA-CHROMIUM PO Take 1 capsule by mouth 3 (three) times daily as needed (weight control).       No facility-administered encounter medications on file as of 10/22/2012.    EXAM:  BP 116/84  Pulse 87  Temp(Src) 98.2 F (36.8 C) (Oral)  Wt 186 lb (84.369 kg)  BMI 30.47 kg/m2  SpO2 99%  LMP 09/26/2012  Body mass index is 30.47 kg/(m^2). Wt Readings from Last 3 Encounters:  10/22/12 186 lb (84.369 kg)  09/22/12 193 lb (87.544 kg)  02/07/11 201 lb (91.173 kg)    GENERAL:  vitals reviewed and listed above, alert, oriented, appears well hydrated and in no acute distress cheswing gum slight congestion  HEENT: atraumatic, conjunctiva  clear, no obvious abnormalities on inspection of external nose and ears midl congestion OP : no lesion edema or exudate   Tm right  Slight distortion grey no fluid   NECK: no obvious masses on inspection palpation  No bruits   LUNGS: clear to auscultation bilaterally, no wheezes, rales or rhonchi, good air movement  CV: HRRR, no clubbing cyanosis or  peripheral edema nl cap refill   MS: moves all extremities without noticeable focal  abnormality  PSYCH: pleasant and cooperative, no obvious depression or anxiety  ASSESSMENT AND PLAN:  Discussed the following assessment and plan:  Eustachian tube dysfunction, right - see dr Ezzard Standing   add flonase   TINNITUS, RIGHT  BMI 31.0-31.9,adult - .c  Medication management - has been on med phentermine 30  2 weeks diet change  has los t 7 # since last viist   Allergic rhinitis - add flonase No sig se of med  Seems motivated at this time  Disc starategies  Exercise planning etc .    -Patient  advised to return or notify health care team  if symptoms worsen or persist or new concerns arise.  Patient Instructions  Continue lifestyle intervention healthy eating and  Add exercise . Add flonase   . May want to decrease   Gum chewing if jaw is boihering you at the tmj  Area.   ROV in 1 month.     Neta Mends. Dasie Chancellor M.D.

## 2012-10-22 NOTE — Patient Instructions (Signed)
Continue lifestyle intervention healthy eating and  Add exercise . Add flonase   . May want to decrease   Gum chewing if jaw is boihering you at the tmj  Area.   ROV in 1 month.

## 2012-11-22 ENCOUNTER — Telehealth: Payer: Self-pay | Admitting: Internal Medicine

## 2012-11-22 ENCOUNTER — Other Ambulatory Visit: Payer: Self-pay | Admitting: Internal Medicine

## 2012-11-22 MED ORDER — PHENTERMINE HCL 30 MG PO CAPS: 30.0000 mg | ORAL_CAPSULE | ORAL | Status: DC

## 2012-11-22 NOTE — Telephone Encounter (Signed)
Pt notified it was called to the office.  Instructed to make appt before this refill runs out.  Will call the office back when she has her appt book.

## 2012-11-22 NOTE — Telephone Encounter (Signed)
Last seen in May for a follow up on phentermine.  Please advise. Thanks!!

## 2012-11-22 NOTE — Telephone Encounter (Signed)
Sandra Key/patient Phone [phone] called to speak with Dr Rosezella Florida nurse regarging refill of Phentermine.  Asking if she need to be seen before refill.  Reports curbed appetite.  Lost approximately 18 lbs in 6 weeks.  Has 3 tablets remaining.   Exercising more often.  Per 10/22/12 MD note, informed MD expected follow up visit in 4 weeks. Reports it is difficult to get away from work but will do what has to be done.  Please call back regarding appointment/refill request.

## 2012-11-22 NOTE — Telephone Encounter (Signed)
Ok to refill  20# needs OV before next refill .

## 2012-12-16 ENCOUNTER — Other Ambulatory Visit: Payer: Self-pay | Admitting: Internal Medicine

## 2012-12-16 DIAGNOSIS — Z1231 Encounter for screening mammogram for malignant neoplasm of breast: Secondary | ICD-10-CM

## 2012-12-17 ENCOUNTER — Ambulatory Visit (INDEPENDENT_AMBULATORY_CARE_PROVIDER_SITE_OTHER): Payer: PRIVATE HEALTH INSURANCE | Admitting: Internal Medicine

## 2012-12-17 ENCOUNTER — Encounter: Payer: Self-pay | Admitting: Internal Medicine

## 2012-12-17 VITALS — BP 108/76 | Temp 98.0°F | Wt 178.0 lb

## 2012-12-17 DIAGNOSIS — Z79899 Other long term (current) drug therapy: Secondary | ICD-10-CM

## 2012-12-17 DIAGNOSIS — E669 Obesity, unspecified: Secondary | ICD-10-CM

## 2012-12-17 DIAGNOSIS — Z6829 Body mass index (BMI) 29.0-29.9, adult: Secondary | ICD-10-CM

## 2012-12-17 MED ORDER — PHENTERMINE HCL 30 MG PO CAPS: 30.0000 mg | ORAL_CAPSULE | ORAL | Status: DC

## 2012-12-17 NOTE — Progress Notes (Signed)
Chief Complaint  Patient presents with  . Follow-up    HPI: No new health issues    FU phentermine management  Working well.      Eating healthier  And watching and cutting less carbs and  Fruits and veges.   Walking  3  X per week.   Sleep ok if take in am med .    Earlier now coffee.  7- 8 am and then pill 11- 12   Allergy itching takes  Zyrtec  ROS: See pertinent positives and negatives per HPI. Neg cp sob  Psych se   bleeding ha  Syncope   Past Medical History  Diagnosis Date  . Allergic rhinitis     History of allergy testing positive  . Asthma   . Recurrent pregnancy loss   . Tinnitus, right     Had mri  and eustachian tube dysfunction    History reviewed. No pertinent family history.  History   Social History  . Marital Status: Married    Spouse Name: N/A    Number of Children: N/A  . Years of Education: N/A   Social History Main Topics  . Smoking status: Never Smoker   . Smokeless tobacco: None  . Alcohol Use: Yes     Comment: occ.  . Drug Use: No  . Sexually Active:    Other Topics Concern  . None   Social History Narrative   Separated husband in [place]  Coming back so far ,    Son with autism  One at [place] one at  [employer] 9th grade.    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       Husband not yet working.  Back up from [place]                 Outpatient Encounter Prescriptions as of 12/17/2012  Medication Sig Dispense Refill  . cetirizine (ZYRTEC) 10 MG tablet Take 10 mg by mouth daily.      . phentermine 30 MG capsule Take 1 capsule (30 mg total) by mouth every morning.  30 capsule  1  . [DISCONTINUED] phentermine 30 MG capsule Take 1 capsule (30 mg total) by mouth every morning.  20 capsule  0  . [DISCONTINUED] fluticasone (FLONASE) 50  MCG/ACT nasal spray 2 spray each nostril qd  16 g  3  . [DISCONTINUED] naproxen (NAPROSYN) 375 MG tablet Take 1 tablet (375 mg total) by mouth 2 (two) times daily.  20 tablet  0   No facility-administered encounter medications on file as of 12/17/2012.    EXAM:  BP 108/76  Temp(Src) 98 F (36.7 C) (Oral)  Wt 178 lb (80.74 kg)  BMI 29.16 kg/m2  LMP 11/30/2012  Body mass index is 29.16 kg/(m^2). Wt Readings from Last 3 Encounters:  12/17/12 178 lb (80.74 kg)  10/22/12 186 lb (84.369 kg)  09/22/12 193 lb (87.544 kg)    GENERAL: vitals reviewed and listed above, alert, oriented, appears well hydrated and in no acute distress HEENT: atraumatic, conjunctiva  clear, no obvious abnormalities on inspection of external nose and ears  NECK: no obvious masses on inspection palpation  LUNGS: clear to auscultation bilaterally, no wheezes, rales or  rhonchi, good air movement CV: HRRR, no clubbing cyanosis or  peripheral edema nl cap refill  Abdomen:  Sof,t normal bowel sounds without hepatosplenomegaly, no guarding rebound or masses no CVA tenderness MS: moves all extremities without noticeable focal  abnormality PSYCH: pleasant and cooperative, no obvious depression or anxiety  ASSESSMENT AND PLAN:  Discussed the following assessment and plan:  Obesity, unspecified - much improved  continue  med as long as sucesss and no se  disc fda indication is for short term    BMI 29.0-29.9,adult  Medication management Has lost over 20 # so far   With healthy ls strategies in addition to meds.    Cautioned to contineu lsi so when goes off med will avoid relapse.  Also attent to sleep hygiene   Risk benefit of medication discussed. Benefit more than risk at this time  -Patient advised to return or notify health care team  if symptoms worsen or persist or new concerns arise.  Patient Instructions  Monitor your sleep to not get  Sleep deprivation is assoc with weight gain .    Ok to remain on  medication since it has been helpful  And no  Concerning side effects.   ROV in about 8 weeks or as needed      Burna Mortimer K. Leydy Worthey M.D. Total visit > 50% spent counseling and coordinating care

## 2012-12-17 NOTE — Patient Instructions (Signed)
Monitor your sleep to not get  Sleep deprivation is assoc with weight gain .    Ok to remain on medication since it has been helpful  And no  Concerning side effects.   ROV in about 8 weeks or as needed

## 2012-12-21 ENCOUNTER — Ambulatory Visit (HOSPITAL_COMMUNITY)
Admission: RE | Admit: 2012-12-21 | Discharge: 2012-12-21 | Disposition: A | Discharge status: Home / Self Care | Payer: PRIVATE HEALTH INSURANCE | Source: Ambulatory Visit | Attending: Internal Medicine | Admitting: Internal Medicine

## 2012-12-21 DIAGNOSIS — Z1231 Encounter for screening mammogram for malignant neoplasm of breast: Secondary | ICD-10-CM | POA: Insufficient documentation

## 2013-02-01 ENCOUNTER — Encounter: Payer: PRIVATE HEALTH INSURANCE | Admitting: Internal Medicine

## 2013-02-01 ENCOUNTER — Encounter: Payer: Self-pay | Admitting: Internal Medicine

## 2013-02-01 DIAGNOSIS — Z0289 Encounter for other administrative examinations: Secondary | ICD-10-CM

## 2013-02-01 NOTE — Progress Notes (Signed)
Document opened and reviewed for OV but appt  canceled  No show same day .

## 2013-04-01 ENCOUNTER — Telehealth: Payer: Self-pay | Admitting: Internal Medicine

## 2013-04-01 NOTE — Telephone Encounter (Signed)
Ok

## 2013-04-01 NOTE — Telephone Encounter (Signed)
Pt needs a 4pm appt due to her work schedule. Needs to see you for fu on meds and a knot on her neck. It is ok to schedule.

## 2013-04-04 NOTE — Telephone Encounter (Signed)
Pt is sch for 04-12-13 1045am

## 2013-04-12 ENCOUNTER — Ambulatory Visit (INDEPENDENT_AMBULATORY_CARE_PROVIDER_SITE_OTHER): Payer: BC Managed Care – PPO | Admitting: Internal Medicine

## 2013-04-12 ENCOUNTER — Encounter: Payer: Self-pay | Admitting: Internal Medicine

## 2013-04-12 VITALS — BP 110/80 | Temp 98.5°F | Wt 178.0 lb

## 2013-04-12 DIAGNOSIS — R599 Enlarged lymph nodes, unspecified: Secondary | ICD-10-CM

## 2013-04-12 DIAGNOSIS — Z23 Encounter for immunization: Secondary | ICD-10-CM

## 2013-04-12 DIAGNOSIS — Z6829 Body mass index (BMI) 29.0-29.9, adult: Secondary | ICD-10-CM

## 2013-04-12 DIAGNOSIS — Z299 Encounter for prophylactic measures, unspecified: Secondary | ICD-10-CM

## 2013-04-12 DIAGNOSIS — Z79899 Other long term (current) drug therapy: Secondary | ICD-10-CM

## 2013-04-12 DIAGNOSIS — R590 Localized enlarged lymph nodes: Secondary | ICD-10-CM

## 2013-04-12 DIAGNOSIS — J069 Acute upper respiratory infection, unspecified: Secondary | ICD-10-CM

## 2013-04-12 MED ORDER — PHENTERMINE HCL 30 MG PO CAPS: 30.0000 mg | ORAL_CAPSULE | ORAL | Status: DC

## 2013-04-12 NOTE — Patient Instructions (Signed)
Continue lifestyle intervention healthy eating and exercise . don't think the  Ln is significant  . Fu if  progressive concerns  ROV in 2-3 months if want to remain on phentermine

## 2013-04-12 NOTE — Progress Notes (Signed)
Chief Complaint  Patient presents with  . knot on neck    refill phentermine   . 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 She did have a URI also with congestion. She is out of the phentermine had been doing well would like a refill if possible she still trying to do lifestyle changes has maintained her weight loss so far no significant side effects cardiovascular pulmonary. 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 Had PPD test that was negative last year she brings results no exposure since that time travel high-risk exposures. ROS: See pertinent positives and negatives per HPI. Negative chest pain shortness of breath wheezing as above no new orthopedic neurologic symptoms  Past Medical History  Diagnosis Date  . Allergic rhinitis     History of allergy testing positive  . Asthma   . Recurrent pregnancy loss   . Tinnitus, right     Had mri  and eustachian tube dysfunction    History reviewed. No pertinent family history.  History   Social History  . Marital Status: Married    Spouse Name: N/A    Number of Children: N/A  . Years of Education: N/A   Social History Main Topics  . Smoking status: Never Smoker   . Smokeless tobacco: None  . Alcohol Use: Yes     Comment: occ.  . Drug Use: No  . Sexual Activity:    Other Topics Concern  . None   Social History Narrative   Separated husband in [place]  Coming back so far ,    Son with autism  One at [employer] one at  [employer] 9th grade.    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       Husband not yet  working.  Back up from [place]                 Outpatient Encounter Prescriptions as of 04/12/2013  Medication Sig  . cetirizine (ZYRTEC) 10 MG tablet Take 10 mg by mouth daily.  . phentermine 30 MG capsule Take 1 capsule (30 mg total) by mouth every morning.  . [DISCONTINUED] phentermine 30 MG capsule Take 1 capsule (30 mg total) by mouth every morning.    EXAM:  BP 110/80  Temp(Src) 98.5 F (36.9 C) (Oral)  Wt 178 lb (80.74 kg)  Body mass index is 29.16 kg/(m^2).  GENERAL: vitals reviewed and listed above, alert, oriented, appears well hydrated and in no acute distress  HEENT: atraumatic, conjunctiva  clear, no obvious abnormalities on inspection of external nose and ears mild congestion noted TMs clear OP : no lesion edema or exudate   NECK: no obvious masses on inspection palpation there is a very small less than 1C meter shoddy left PC nodes nontender no other cervical supraclavicular adenopathy. Abdomen soft without organomegaly guarding or rebound LUNGS: clear to auscultation bilaterally, no wheezes, rales or rhonchi, good air movement CV: HRRR, no clubbing cyanosis  or  peripheral edema nl cap refill  Skin no acute rashes or petechiae. MS: moves all extremities without noticeable focal  abnormality PSYCH: pleasant and cooperative, no obvious depression or anxiety Wt Readings from Last 3 Encounters:  04/12/13 178 lb (80.74 kg)  12/17/12 178 lb (80.74 kg)  10/22/12 186 lb (84.369 kg)   Reviewed forms completed. ASSESSMENT AND PLAN:  Discussed the following assessment and plan:  Reactive cervical lymphadenopathy - Old non-alarming follow if changes or concerns.  URI, acute - Appears to have been be uncomplicated.  BMI 29.0-29.9,adult - Maintaining weight loss no significant side effect will give 30 days plus one refill and followup in January.  Need for prophylactic vaccination and inoculation against influenza - Plan: Flu Vaccine QUAD 36+ mos PF IM  (Fluarix)  Medication management  Preventive measure - form completed no need for PPD at this time neg 8 13   -Patient advised to return or notify health care team  if symptoms worsen or persist or new concerns arise.  Patient Instructions  Continue lifestyle intervention healthy eating and exercise . don't think the  Ln is significant  . Fu if  progressive concerns  ROV in 2-3 months if want to remain on phentermine      Orlandus Borowski K. [place] Moss M.D.

## 2013-04-15 ENCOUNTER — Encounter: Payer: Self-pay | Admitting: Internal Medicine

## 2013-04-15 DIAGNOSIS — R590 Localized enlarged lymph nodes: Secondary | ICD-10-CM | POA: Insufficient documentation

## 2013-04-15 DIAGNOSIS — Z299 Encounter for prophylactic measures, unspecified: Secondary | ICD-10-CM | POA: Insufficient documentation

## 2013-06-20 ENCOUNTER — Telehealth: Payer: Self-pay | Admitting: Internal Medicine

## 2013-06-20 NOTE — Telephone Encounter (Signed)
lmom for pt to make appt. °

## 2013-06-20 NOTE — Telephone Encounter (Signed)
Pt is requesting refill of  phentermine 30 MG capsule  (30 day) Pt has 2 wks of this left of this med.. Pt also request refill of zolpidem (AMBIEN) 10 MG tablet  Advised pt she may need appt. Pt declided until I asked for [facility]ill [facility]

## 2013-06-20 NOTE — Telephone Encounter (Signed)
She must make OV if she wants to remain on phentermine.  Thanks!

## 2013-06-21 NOTE — Telephone Encounter (Signed)
lmom for pt to make appt for refills.

## 2013-07-15 ENCOUNTER — Telehealth: Payer: Self-pay | Admitting: Internal Medicine

## 2013-07-15 MED ORDER — PHENTERMINE HCL 30 MG PO CAPS: 30.0000 mg | ORAL_CAPSULE | ORAL | Status: DC

## 2013-07-15 NOTE — Telephone Encounter (Signed)
Spoke to the pt.  Notified her that I will call in 1 refill and that she will have to be seen before that prescription runs out before she can receive further refills.  She is currently moving but will call and make an appt in the next 2 - 3 weeks.

## 2013-07-15 NOTE — Telephone Encounter (Signed)
Per WP, pt must make an appt.

## 2013-07-15 NOTE — Telephone Encounter (Signed)
Pt will like refill of phentermine 30 MG capsule Pt refused to make an appt until I asked if Dr Fabian SharpPanosh will refill w/out.an appt, Pt states she has been moving and her time limited. Even a 15 day rx would be ok  [facility] [address] [employer]

## 2013-07-15 NOTE — Telephone Encounter (Signed)
Make appt any last of day appt.  In march Refill medication #30 no refill  Until has appt.

## 2013-07-15 NOTE — Telephone Encounter (Signed)
Pt cannot take off work and would need a 4:30 appt to come in any time soon. Advised pt we do not have any appts at that time. Pt will try to work out a time in march for an appt.

## 2013-08-10 ENCOUNTER — Telehealth: Payer: Self-pay | Admitting: Internal Medicine

## 2013-08-10 NOTE — Telephone Encounter (Signed)
Ok to refill valtrex x 1

## 2013-08-10 NOTE — Telephone Encounter (Signed)
Pt scheduled for ov on 08/25/13, however states she is out of Valtrex and is requesting approval from PCP to have refill [facility] [address]

## 2013-08-10 NOTE — Telephone Encounter (Signed)
Last seen in Nov 2014.  Valtrex not prescribed since 2012.  Please advise.  Thanks!

## 2013-08-11 MED ORDER — VALACYCLOVIR HCL 1 G PO TABS: 1000.0000 mg | ORAL_TABLET | Freq: Two times a day (BID) | ORAL | Status: AC

## 2013-08-11 NOTE — Telephone Encounter (Signed)
Sent to the pharmacy by e-scribe. 

## 2013-08-25 ENCOUNTER — Ambulatory Visit: Payer: BC Managed Care – PPO | Admitting: Internal Medicine

## 2013-09-09 ENCOUNTER — Encounter: Payer: Self-pay | Admitting: Internal Medicine

## 2013-09-09 ENCOUNTER — Ambulatory Visit (INDEPENDENT_AMBULATORY_CARE_PROVIDER_SITE_OTHER): Payer: BC Managed Care – PPO | Admitting: Internal Medicine

## 2013-09-09 VITALS — BP 116/74 | Temp 98.4°F | Ht 66.0 in | Wt 168.0 lb

## 2013-09-09 DIAGNOSIS — R599 Enlarged lymph nodes, unspecified: Secondary | ICD-10-CM

## 2013-09-09 DIAGNOSIS — Z79899 Other long term (current) drug therapy: Secondary | ICD-10-CM

## 2013-09-09 DIAGNOSIS — M79609 Pain in unspecified limb: Secondary | ICD-10-CM

## 2013-09-09 DIAGNOSIS — E669 Obesity, unspecified: Secondary | ICD-10-CM

## 2013-09-09 DIAGNOSIS — R590 Localized enlarged lymph nodes: Secondary | ICD-10-CM

## 2013-09-09 DIAGNOSIS — M79671 Pain in right foot: Secondary | ICD-10-CM | POA: Insufficient documentation

## 2013-09-09 MED ORDER — PHENTERMINE HCL 30 MG PO CAPS: 30.0000 mg | ORAL_CAPSULE | ORAL | Status: DC

## 2013-09-09 NOTE — Progress Notes (Signed)
Chief Complaint  Patient presents with  . Follow-up    phentermine meds     HPI:  FU medication management  Med no sig se per patient  1. Weight control phentermine skips ocassional    Has been still losing weight although weight went up at [place] some.  Would like to lose 10 more pounds   Refill of val;trex  Given previously  No need for rrf right now.   Check small bump left neck fam hx of lymph  node cancer   Right foot pain for months  Top of foot after walking  Problematic  No injury  Same place on foot   Wt Readings from Last 3 Encounters:  09/09/13 168 lb (76.204 kg)  04/12/13 178 lb (80.74 kg)  12/17/12 178 lb (80.74 kg)     ROS: See pertinent positives and negatives per HPI.neg cv pulm some allergy/ no feer   Past Medical History  Diagnosis Date  . Allergic rhinitis     History of allergy testing positive  . Asthma   . Recurrent pregnancy loss   . Tinnitus, right     Had mri  and eustachian tube dysfunction    History reviewed. No pertinent family history.  History   Social History  . Marital Status: Married    Spouse Name: N/A    Number of Children: N/A  . Years of Education: N/A   Social History Main Topics  . Smoking status: Never Smoker   . Smokeless tobacco: None  . Alcohol Use: Yes     Comment: occ.  . Drug Use: No  . Sexual Activity:    Other Topics Concern  . None   Social History Narrative   Separated husband in [place]  Coming back so far ,    Son with autism  One at [employer] one at  [employer] 9th grade.    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       Husband not yet working.  Back up from [place]                 Outpatient Encounter Prescriptions as of 09/09/2013  Medication Sig  . cetirizine (ZYRTEC) 10 MG tablet Take 10 mg  by mouth daily.  . phentermine 30 MG capsule Take 1 capsule (30 mg total) by mouth every morning.  . valACYclovir (VALTREX) 1000 MG tablet Take 1 tablet (1,000 mg total) by mouth 2 (two) times daily.  . [DISCONTINUED] phentermine 30 MG capsule Take 1 capsule (30 mg total) by mouth every morning.    EXAM:  BP 116/74  Temp(Src) 98.4 F (36.9 C) (Oral)  Ht 5\' 6"  (1.676 m)  Wt 168 lb (76.204 kg)  BMI 27.13 kg/m2  Body mass index is 27.13 kg/(m^2).  GENERAL: vitals reviewed and listed above, alert, oriented, appears well hydrated and in no acute distress HEENT: atraumatic, conjunctiva  clear, no obvious abnormalities on inspection of external nose and ears OP : no lesion edema or exudate  NECK: no obvious masses on inspection palpation  Small shoddy left pc node about 1/2 cm +  LUNGS: clear to auscultation bilaterally, no wheezes, rales or rhonchi, good air movement CV: HRRR, no clubbing  cyanosis or  peripheral edema nl cap refill  MS: moves all extremities without noticeable focal  Abnormality right foot nl rom nv seems ok local pain dorsal mid foot   butnot  Bony point tenderness PSYCH: pleasant and cooperative, no obvious depression or anxiety ASSESSMENT AND PLAN:  Discussed the following assessment and plan:  Right foot pain - tendinitis vs other overuse  consider stres fx also   Reactive cervical lymphadenopathy  Medication management - no sig se of me  Obesity, unspecified - resolved   just overweight now  weight prev 190 + range   -Patient advised to return or notify health care team  if symptoms worsen ,persist or new concerns arise.  Patient Instructions  .Continue lifestyle intervention healthy eating and exercise . 150 minutes of exercise weeks  ,  Lose weight  To healthy levels. Avoid trans fats and processed foods;  Increase fresh fruits and veges to 5 servings per day. And avoid sweet beverages  Including tea and juice.  return office visit in 2-3 months or as  needed. wil arrange sports medicine check for your foot pain  Good shoes with arch suypport ice at end of day and can try aleve 1-2 twice a day for a week in the meantime.    Neta MendsWanda K. Epifania Littrell M.D.  Pre visit review using our clinic review tool, if applicable. No additional management support is needed unless otherwise documented below in the visit note.

## 2013-09-09 NOTE — Patient Instructions (Signed)
.  Continue lifestyle intervention healthy eating and exercise . 150 minutes of exercise weeks  ,  Lose weight  To healthy levels. Avoid trans fats and processed foods;  Increase fresh fruits and veges to 5 servings per day. And avoid sweet beverages  Including tea and juice.  return office visit in 2-3 months or as needed. wil arrange sports medicine check for your foot pain  Good shoes with arch suypport ice at end of day and can try aleve 1-2 twice a day for a week in the meantime.

## 2013-09-14 ENCOUNTER — Ambulatory Visit: Payer: BC Managed Care – PPO | Admitting: Family Medicine

## 2013-09-14 DIAGNOSIS — Z0289 Encounter for other administrative examinations: Secondary | ICD-10-CM

## 2013-11-28 ENCOUNTER — Telehealth: Payer: Self-pay | Admitting: Internal Medicine

## 2013-11-28 NOTE — Telephone Encounter (Signed)
Pt is still having foot pain and ?abd gas . Pt is on phentamine and would like to have blood work. Pt does not want to wait until Oct for cpx. Pt is teacher please advise

## 2013-11-29 NOTE — Telephone Encounter (Signed)
Spoke to the pt.  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  Made future lab and CPE appt.  She will then talk to Dr. Fabian SharpPanosh about toe pain and gas.  Will place orders for lab work.

## 2013-12-07 ENCOUNTER — Other Ambulatory Visit: Payer: Self-pay | Admitting: Internal Medicine

## 2013-12-07 DIAGNOSIS — Z1231 Encounter for screening mammogram for malignant neoplasm of breast: Secondary | ICD-10-CM

## 2013-12-14 ENCOUNTER — Other Ambulatory Visit (INDEPENDENT_AMBULATORY_CARE_PROVIDER_SITE_OTHER): Payer: BC Managed Care – PPO

## 2013-12-14 DIAGNOSIS — Z0000 Encounter for general adult medical examination without abnormal findings: Secondary | ICD-10-CM

## 2013-12-14 LAB — LIPID PANEL
CHOL/HDL RATIO: 2
Cholesterol: 166 mg/dL (ref 0–200)
HDL: 74.9 mg/dL (ref >39.00)
LDL Cholesterol: 72 mg/dL (ref 0–99)
NonHDL: 91.1
TRIGLYCERIDES: 95 mg/dL (ref 0.0–149.0)
VLDL: 19 mg/dL (ref 0.0–40.0)

## 2013-12-14 LAB — CBC WITH DIFFERENTIAL/PLATELET
Basophils Absolute: 0 10*3/uL (ref 0.0–0.1)
Basophils Relative: 0.5 % (ref 0.0–3.0)
EOS ABS: 0.2 10*3/uL (ref 0.0–0.7)
Eosinophils Relative: 2.7 % (ref 0.0–5.0)
HCT: 34.4 % — ABNORMAL LOW (ref 36.0–46.0)
Hemoglobin: 11.3 g/dL — ABNORMAL LOW (ref 12.0–15.0)
LYMPHS PCT: 27.4 % (ref 12.0–46.0)
Lymphs Abs: 1.7 10*3/uL (ref 0.7–4.0)
MCHC: 32.9 g/dL (ref 30.0–36.0)
MCV: 86.2 fl (ref 78.0–100.0)
Monocytes Absolute: 0.3 10*3/uL (ref 0.1–1.0)
Monocytes Relative: 5 % (ref 3.0–12.0)
NEUTROS PCT: 64.4 % (ref 43.0–77.0)
Neutro Abs: 4 10*3/uL (ref 1.4–7.7)
Platelets: 299 10*3/uL (ref 150.0–400.0)
RBC: 3.99 Mil/uL (ref 3.87–5.11)
RDW: 15.5 % (ref 11.5–15.5)
WBC: 6.2 10*3/uL (ref 4.0–10.5)

## 2013-12-14 LAB — HEPATIC FUNCTION PANEL
ALBUMIN: 3.7 g/dL (ref 3.5–5.2)
ALK PHOS: 35 U/L — AB (ref 39–117)
ALT: 17 U/L (ref 0–35)
AST: 25 U/L (ref 0–37)
BILIRUBIN DIRECT: 0 mg/dL (ref 0.0–0.3)
Total Bilirubin: 0.5 mg/dL (ref 0.2–1.2)
Total Protein: 6.5 g/dL (ref 6.0–8.3)

## 2013-12-14 LAB — BASIC METABOLIC PANEL
BUN: 13 mg/dL (ref 6–23)
CALCIUM: 8.9 mg/dL (ref 8.4–10.5)
CHLORIDE: 105 meq/L (ref 96–112)
CO2: 28 meq/L (ref 19–32)
CREATININE: 0.8 mg/dL (ref 0.4–1.2)
GFR: 96.54 mL/min (ref >60.00)
GLUCOSE: 97 mg/dL (ref 70–99)
Potassium: 3.9 mEq/L (ref 3.5–5.1)
Sodium: 137 mEq/L (ref 135–145)

## 2013-12-14 LAB — TSH: TSH: 0.92 u[IU]/mL (ref 0.35–4.50)

## 2013-12-21 ENCOUNTER — Encounter: Payer: Self-pay | Admitting: Internal Medicine

## 2013-12-21 ENCOUNTER — Ambulatory Visit (INDEPENDENT_AMBULATORY_CARE_PROVIDER_SITE_OTHER): Payer: BC Managed Care – PPO | Admitting: Internal Medicine

## 2013-12-21 ENCOUNTER — Other Ambulatory Visit (HOSPITAL_COMMUNITY)
Admission: RE | Admit: 2013-12-21 | Discharge: 2013-12-21 | Disposition: A | Discharge status: Home / Self Care | Payer: BC Managed Care – PPO | Source: Ambulatory Visit | Attending: Internal Medicine | Admitting: Internal Medicine

## 2013-12-21 VITALS — BP 112/74 | HR 84 | Temp 98.4°F | Ht 66.0 in | Wt 159.0 lb

## 2013-12-21 DIAGNOSIS — D649 Anemia, unspecified: Secondary | ICD-10-CM

## 2013-12-21 DIAGNOSIS — Z0000 Encounter for general adult medical examination without abnormal findings: Secondary | ICD-10-CM

## 2013-12-21 DIAGNOSIS — G479 Sleep disorder, unspecified: Secondary | ICD-10-CM

## 2013-12-21 DIAGNOSIS — Z113 Encounter for screening for infections with a predominantly sexual mode of transmission: Secondary | ICD-10-CM | POA: Insufficient documentation

## 2013-12-21 DIAGNOSIS — Z79899 Other long term (current) drug therapy: Secondary | ICD-10-CM

## 2013-12-21 MED ORDER — PHENTERMINE HCL 30 MG PO CAPS: 30.0000 mg | ORAL_CAPSULE | ORAL | Status: DC

## 2013-12-21 NOTE — Progress Notes (Signed)
Chief Complaint  Patient presents with  . Annual Exam    with pap    HPI: Patient comes in today for Preventive Health Care visit  Periods: heavy last time has had BTL no vag sx wants sti screen pap last year neg and neg hpv.   phentermine on weekends planing  10 more pounds to lose.   Sleep an issue as always  Foot pain is better  Health Maintenance  Topic Date Due  . Influenza Vaccine  12/31/2013  . Pap Smear  09/23/2015  . Tetanus/tdap  06/24/2017   Health Maintenance Review LIFESTYLE:  Exercise:  Yes when can  Tobacco/ETS:no Alcohol: no Sugar beverages:no Sleep:erratic many reasons Drug use: no  ROS:  GEN/ HEENT: No fever, significant weight changes sweats headaches vision problems hearing changes, CV/ PULM; No chest pain shortness of breath cough, syncope,edema  change in exercise tolerance. GI /GU: No adominal pain, vomiting, change in bowel habits. No blood in the stool. No significant GU symptoms. ocass sharp pain luq no ass sx  SKIN/HEME: ,no acute skin rashes suspicious lesions or bleeding. No lymphadenopathy, nodules, masses.  NEURO/ PSYCH:  No neurologic signs such as weakness numbness.  IMM/ Allergy: No unusual infections.  Allergy .   REST of 12 system review negative except as per HPI   Past Medical History  Diagnosis Date  . Allergic rhinitis     History of allergy testing positive  . Asthma   . Recurrent pregnancy loss   . Tinnitus, right     Had mri  and eustachian tube dysfunction   Past Surgical History  Procedure Laterality Date  . Cesarean section    . Breast reduction surgery    . Tympanostomy tube placement  2009    right       History reviewed. No pertinent family history.  History   Social History  . Marital Status: Married    Spouse Name: N/A    Number of Children: N/A  . Years of Education: N/A   Social History Main Topics  . Smoking status: Never Smoker   . Smokeless tobacco: None  . Alcohol Use: Yes     Comment: occ.   . Drug Use: No  . Sexual Activity:    Other Topics Concern  . None   Social History Narrative   Separated husband in [place]  Coming back so far ,    Son with autism  One at [employer] one at  [employer] 9th grade.    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    Husband not yet working.  Back up from [place]    Separated    hh of 4 no ets              Outpatient Encounter Prescriptions as of 12/21/2013  Medication Sig  . cetirizine (ZYRTEC) 10 MG tablet Take 10 mg by mouth daily.  . phentermine 30 MG capsule Take 1 capsule (30 mg total) by mouth every morning.  . valACYclovir (VALTREX) 1000 MG tablet Take 1 tablet (1,000 mg total) by mouth 2 (two) times daily.  . [DISCONTINUED] phentermine 30 MG capsule Take 1 capsule (30 mg total) by mouth every morning.  EXAM:  BP 112/74  Pulse 84  Temp(Src) 98.4 F (36.9 C) (Oral)  Ht 5\' 6"  (1.676 m)  Wt 159 lb (72.122 kg)  BMI 25.68 kg/m2  Body mass index is 25.68 kg/(m^2).  Physical Exam: Vital signs reviewed LKG:MWNUGEN:This is a well-developed well-nourished alert cooperative    who appearsr stated age in no acute distress.  HEENT: normocephalic atraumatic , Eyes: PERRL EOM's full, conjunctiva clear, Nares: paten,t no deformity discharge or tenderness., Ears: no deformity EAC's clear TMs with normal landmarks. Mouth: clear OP, no lesions, edema.  Moist mucous membranes. Dentition in adequate repair. NECK: supple without masses, thyromegaly or bruits. CHEST/PULM:  Clear to auscultation and percussion breath sounds equal no wheeze , rales or rhonchi. No chest wall deformities or tenderness. Breast no nodules scars healed  CV: PMI is nondisplaced, S1 S2 no gallops, murmurs, rubs. Peripheral pulses are full without delay.No JVD .  ABDOMEN: Bowel  sounds normal nontender  No guard or rebound, no hepato splenomegal no CVA tenderness.  No hernia. Extremtities:  No clubbing cyanosis or edema, no acute joint swelling or redness no focal atrophy NEURO:  Oriented x3, cranial nerves 3-12 appear to be intact, no obvious focal weakness,gait within normal limits no abnormal reflexes or asymmetrical SKIN: No acute rashes normal turgor, color, no bruising or petechiae. PSYCH: Oriented, good eye contact, no obvious depression anxiety, cognition and judgment appear normal. LN: no cervical axillary inguinal adenopathy Pelvic: NL ext GU, labia clear without lesions or rash . Vagina no lesions .Cervix: clear  UTERUS: Neg CMT Adnexa:  clear no masses . gc chl done ( no pap)   Lab Results  Component Value Date   WBC 6.2 12/14/2013   HGB 11.3* 12/14/2013   HCT 34.4* 12/14/2013   PLT 299.0 12/14/2013   GLUCOSE 97 12/14/2013   CHOL 166 12/14/2013   TRIG 95.0 12/14/2013   HDL 74.90 12/14/2013   LDLCALC 72 12/14/2013   ALT 17 12/14/2013   AST 25 12/14/2013   NA 137 12/14/2013   K 3.9 12/14/2013   CL 105 12/14/2013   CREATININE 0.8 12/14/2013   BUN 13 12/14/2013   CO2 28 12/14/2013   TSH 0.92 12/14/2013    ASSESSMENT AND PLAN:  Discussed the following assessment and plan:  Routine general medical examination at a health care facility - Plan: Cytology - PAP, CANCELED: PAP [[place]]  ANEMIA - mild presumed iron def  add iron as directed  no other alarm features  Medication management - bmi now down to 25.68 ok to use medication on weekends phentermine. fu if needs refill or as needed for this  Sleep disturbance - multifactoria  disc   Patient Care Team: Madelin HeadingsWanda K Lathon Adan, MD as PCP - General Drema Halonhristopher E Newman, MD as Attending Physician (Otolaryngology) Patient Instructions   Healthy lifestyle includes : At least 150 minutes of exercise weeks  , weight at healthy levels, which is usually   BMI 19-25. Avoid trans fats and processed foods;  Increase  fresh fruits and veges to 5 servings per day. And avoid sweet beverages including tea and juice. Mediterranean diet with olive oil and nuts have been noted to be heart and brain healthy . Avoid tobacco products . Limit  alcohol to  7 per week for women and 14 servings for men.  Get adequate sleep .  Will notify you  of labs when available. Exam is ok fu if concerns . Strategies for mood eating . ROV in 3-6 months depending on  Medication needs  Wt Readings from Last 3 Encounters:  12/21/13 159 lb (72.122 kg)  09/09/13 168 lb (76.204 kg)  04/12/13 178 lb (80.74 kg)     Insomnia Insomnia is frequent trouble falling and/or staying asleep. Insomnia can be a long term problem or a short term problem. Both are common. Insomnia can be a short term problem when the wakefulness is related to a certain stress or worry. Long term insomnia is often related to ongoing stress during waking hours and/or poor sleeping habits. Overtime, sleep deprivation itself can make the problem worse. Every little thing feels more severe because you are overtired and your ability to cope is decreased. CAUSES   Stress, anxiety, and depression.  Poor sleeping habits.  Distractions such as TV in the bedroom.  Naps close to bedtime.  Engaging in emotionally charged conversations before bed.  Technical reading before sleep.  Alcohol and other sedatives. They may make the problem worse. They can hurt normal sleep patterns and normal dream activity.  Stimulants such as caffeine for several hours prior to bedtime.  Pain syndromes and shortness of breath can cause insomnia.  Exercise late at night.  Changing time zones may cause sleeping problems (jet lag). It is sometimes helpful to have someone observe your sleeping patterns. They should look for periods of not breathing during the night (sleep apnea). They should also look to see how long those periods last. If you live alone or observers are uncertain, you can  also be observed at a sleep clinic where your sleep patterns will be professionally monitored. Sleep apnea requires a checkup and treatment. Give your caregivers your medical history. Give your caregivers observations your family has made about your sleep.  SYMPTOMS   Not feeling rested in the morning.  Anxiety and restlessness at bedtime.  Difficulty falling and staying asleep. TREATMENT   Your caregiver may prescribe treatment for an underlying medical disorders. Your caregiver can give advice or help if you are using alcohol or other drugs for self-medication. Treatment of underlying problems will usually eliminate insomnia problems.  Medications can be prescribed for short time use. They are generally not recommended for lengthy use.  Over-the-counter sleep medicines are not recommended for lengthy use. They can be habit forming.  You can promote easier sleeping by making lifestyle changes such as:  Using relaxation techniques that help with breathing and reduce muscle tension.  Exercising earlier in the day.  Changing your diet and the time of your last meal. No night time snacks.  Establish a regular time to go to bed.  Counseling can help with stressful problems and worry.  Soothing music and white noise may be helpful if there are background noises you cannot remove.  Stop tedious detailed work at least one hour before bedtime. HOME CARE INSTRUCTIONS   Keep a diary. Inform your caregiver about your progress. This includes any medication side effects. See your caregiver regularly. Take note of:  Times when you are asleep.  Times when you are awake during the night.  The quality of your sleep.  How you feel the next day. This information will help your caregiver care for you.  Get out of bed if you are still awake after 15 minutes. Read or do some quiet activity. Keep the lights down. Wait until you feel sleepy and go back to bed.  Keep regular sleeping and waking  hours. Avoid naps.  Exercise regularly.  Avoid distractions at bedtime. Distractions include watching television or engaging in  any intense or detailed activity like attempting to balance the household checkbook.  Develop a bedtime ritual. Keep a familiar routine of bathing, brushing your teeth, climbing into bed at the same time each night, listening to soothing music. Routines increase the success of falling to sleep faster.  Use relaxation techniques. This can be using breathing and muscle tension release routines. It can also include visualizing peaceful scenes. You can also help control troubling or intruding thoughts by keeping your mind occupied with boring or repetitive thoughts like the old concept of counting sheep. You can make it more creative like imagining planting one beautiful flower after another in your backyard garden.  During your day, work to eliminate stress. When this is not possible use some of the previous suggestions to help reduce the anxiety that accompanies stressful situations. MAKE SURE YOU:   Understand these instructions.  Will watch your condition.  Will get help right away if you are not doing well or get worse. Document Released: 05/16/2000 Document Revised: 08/11/2011 Document Reviewed: 06/16/2007 [facility] Patient Information 2015 [place], [place]. This information is not intended to replace advice given to you by your health care provider. Make sure you discuss any questions you have with your health care provider.     Neta Mends. Catalia Massett M.D.   Counseled.

## 2013-12-21 NOTE — Patient Instructions (Addendum)
Healthy lifestyle includes : At least 150 minutes of exercise weeks  , weight at healthy levels, which is usually   BMI 19-25. Avoid trans fats and processed foods;  Increase fresh fruits and veges to 5 servings per day. And avoid sweet beverages including tea and juice. Mediterranean diet with olive oil and nuts have been noted to be heart and brain healthy . Avoid tobacco products . Limit  alcohol to  7 per week for women and 14 servings for men.  Get adequate sleep .  Will notify you  of labs when available. Exam is ok fu if concerns . Strategies for mood eating . ROV in 3-6 months depending on  Medication needs  Wt Readings from Last 3 Encounters:  12/21/13 159 lb (72.122 kg)  09/09/13 168 lb (76.204 kg)  04/12/13 178 lb (80.74 kg)     Insomnia Insomnia is frequent trouble falling and/or staying asleep. Insomnia can be a long term problem or a short term problem. Both are common. Insomnia can be a short term problem when the wakefulness is related to a certain stress or worry. Long term insomnia is often related to ongoing stress during waking hours and/or poor sleeping habits. Overtime, sleep deprivation itself can make the problem worse. Every little thing feels more severe because you are overtired and your ability to cope is decreased. CAUSES   Stress, anxiety, and depression.  Poor sleeping habits.  Distractions such as TV in the bedroom.  Naps close to bedtime.  Engaging in emotionally charged conversations before bed.  Technical reading before sleep.  Alcohol and other sedatives. They may make the problem worse. They can hurt normal sleep patterns and normal dream activity.  Stimulants such as caffeine for several hours prior to bedtime.  Pain syndromes and shortness of breath can cause insomnia.  Exercise late at night.  Changing time zones may cause sleeping problems (jet lag). It is sometimes helpful to have someone observe your sleeping patterns. They should  look for periods of not breathing during the night (sleep apnea). They should also look to see how long those periods last. If you live alone or observers are uncertain, you can also be observed at a sleep clinic where your sleep patterns will be professionally monitored. Sleep apnea requires a checkup and treatment. Give your caregivers your medical history. Give your caregivers observations your family has made about your sleep.  SYMPTOMS   Not feeling rested in the morning.  Anxiety and restlessness at bedtime.  Difficulty falling and staying asleep. TREATMENT   Your caregiver may prescribe treatment for an underlying medical disorders. Your caregiver can give advice or help if you are using alcohol or other drugs for self-medication. Treatment of underlying problems will usually eliminate insomnia problems.  Medications can be prescribed for short time use. They are generally not recommended for lengthy use.  Over-the-counter sleep medicines are not recommended for lengthy use. They can be habit forming.  You can promote easier sleeping by making lifestyle changes such as:  Using relaxation techniques that help with breathing and reduce muscle tension.  Exercising earlier in the day.  Changing your diet and the time of your last meal. No night time snacks.  Establish a regular time to go to bed.  Counseling can help with stressful problems and worry.  Soothing music and white noise may be helpful if there are background noises you cannot remove.  Stop tedious detailed work at least one hour before bedtime. HOME CARE INSTRUCTIONS  Keep a diary. Inform your caregiver about your progress. This includes any medication side effects. See your caregiver regularly. Take note of:  Times when you are asleep.  Times when you are awake during the night.  The quality of your sleep.  How you feel the next day. This information will help your caregiver care for you.  Get out of bed  if you are still awake after 15 minutes. Read or do some quiet activity. Keep the lights down. Wait until you feel sleepy and go back to bed.  Keep regular sleeping and waking hours. Avoid naps.  Exercise regularly.  Avoid distractions at bedtime. Distractions include watching television or engaging in any intense or detailed activity like attempting to balance the household checkbook.  Develop a bedtime ritual. Keep a familiar routine of bathing, brushing your teeth, climbing into bed at the same time each night, listening to soothing music. Routines increase the success of falling to sleep faster.  Use relaxation techniques. This can be using breathing and muscle tension release routines. It can also include visualizing peaceful scenes. You can also help control troubling or intruding thoughts by keeping your mind occupied with boring or repetitive thoughts like the old concept of counting sheep. You can make it more creative like imagining planting one beautiful flower after another in your backyard garden.  During your day, work to eliminate stress. When this is not possible use some of the previous suggestions to help reduce the anxiety that accompanies stressful situations. MAKE SURE YOU:   Understand these instructions.  Will watch your condition.  Will get help right away if you are not doing well or get worse. Document Released: 05/16/2000 Document Revised: 08/11/2011 Document Reviewed: 06/16/2007 [facility] Patient Information 2015 [place], [place]. This information is not intended to replace advice given to you by your health care provider. Make sure you discuss any questions you have with your health care provider.

## 2013-12-21 NOTE — Progress Notes (Signed)
Pre visit review using our clinic review tool, if applicable. No additional management support is needed unless otherwise documented below in the visit note. 

## 2013-12-22 ENCOUNTER — Other Ambulatory Visit: Payer: Self-pay | Admitting: Internal Medicine

## 2013-12-22 ENCOUNTER — Telehealth: Payer: Self-pay | Admitting: Internal Medicine

## 2013-12-22 DIAGNOSIS — Z299 Encounter for prophylactic measures, unspecified: Secondary | ICD-10-CM

## 2013-12-22 NOTE — Telephone Encounter (Signed)
Spoke with Malachi BondsGloria and new order placed

## 2013-12-22 NOTE — Telephone Encounter (Signed)
The pap that was ordered in the system needs to be ordered exactly what you need ordered. Malachi BondsGloria says oplease call so she can explain to you and they can get this ordered. Thanks!

## 2013-12-22 NOTE — Addendum Note (Signed)
Addended by: Kern ReapVEREEN, Diandra Cimini B on: 12/22/2013 09:30 AM   Modules accepted: Orders

## 2014-01-02 ENCOUNTER — Ambulatory Visit (HOSPITAL_COMMUNITY): Payer: BC Managed Care – PPO | Attending: Internal Medicine

## 2014-02-08 ENCOUNTER — Telehealth: Payer: Self-pay | Admitting: Internal Medicine

## 2014-02-08 NOTE — Telephone Encounter (Signed)
Pt would like to request a rx for ambien. Pt states she is having a hard time sleeping, going through some things. Pt would like 7-10 day rx, [facility] [address]

## 2014-02-08 NOTE — Telephone Encounter (Signed)
ambien 5 mg  1 po hs prn sleep    Disp 10 .  No refills

## 2014-02-09 MED ORDER — ZOLPIDEM TARTRATE 5 MG PO TABS: 5.0000 mg | ORAL_TABLET | Freq: Every evening | ORAL | Status: DC | PRN

## 2014-02-09 NOTE — Telephone Encounter (Signed)
Medication has been called to the pharmacy and left on voicemail.  Tried to notify the pt.  Left message on cell/work and home phone is not in service.

## 2014-02-09 NOTE — Telephone Encounter (Signed)
Patient notified to pick up at the pharmacy. 

## 2014-03-27 ENCOUNTER — Encounter: Payer: Self-pay | Admitting: Internal Medicine

## 2014-03-27 ENCOUNTER — Ambulatory Visit (INDEPENDENT_AMBULATORY_CARE_PROVIDER_SITE_OTHER): Payer: BC Managed Care – PPO | Admitting: Internal Medicine

## 2014-03-27 VITALS — BP 110/66 | Temp 98.2°F | Wt 161.0 lb

## 2014-03-27 DIAGNOSIS — B9789 Other viral agents as the cause of diseases classified elsewhere: Secondary | ICD-10-CM

## 2014-03-27 DIAGNOSIS — S7001XA Contusion of right hip, initial encounter: Secondary | ICD-10-CM

## 2014-03-27 DIAGNOSIS — W19XXXA Unspecified fall, initial encounter: Secondary | ICD-10-CM

## 2014-03-27 DIAGNOSIS — J069 Acute upper respiratory infection, unspecified: Secondary | ICD-10-CM

## 2014-03-27 NOTE — Progress Notes (Signed)
Pre visit review using our clinic review tool, if applicable. No additional management support is needed unless otherwise documented below in the visit note.  Chief Complaint  Patient presents with  . Fall    Happened on Saturday.  Fell on rt hip.  Has soreness and bruising. with cough    HPI: Patient Sandra Key  comes in today for SDA for  new problem evaluation.  Larey SeatFell tripped on toy 2 days ago and fell right hip  No loc walking right away pain and large bruise no rc but advil  also has cough cold and hurts right side with coughing  No sharp pains dyspnea .   Needs note for work   ROS: See pertinent positives and negatives per HPI.no cp otherwise pleurisy sob  No fever   Past Medical History  Diagnosis Date  . Allergic rhinitis     History of allergy testing positive  . Asthma   . Recurrent pregnancy loss   . Tinnitus, right     Had mri  and eustachian tube dysfunction    History reviewed. No pertinent family history.  History   Social History  . Marital Status: Married    Spouse Name: N/A    Number of Children: N/A  . Years of Education: N/A   Social History Main Topics  . Smoking status: Never Smoker   . Smokeless tobacco: None  . Alcohol Use: Yes     Comment: occ.  . Drug Use: No  . Sexual Activity:    Other Topics Concern  . None   Social History Narrative   Separ[place] in [place]  Coming back so far ,    Son with autism  One at [employer] one at  [employer].    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    Husband not yet working.  Back up from [place]    Separated    hh of 4 no ets              Outpatient Encounter Prescriptions as of 03/27/2014  Medication Sig  . cetirizine (ZYRTEC) 10 MG tablet Take 10 mg by mouth daily.  .  phentermine 30 MG capsule Take 1 capsule (30 mg total) by mouth every morning.  . valACYclovir (VALTREX) 1000 MG tablet Take 1 tablet (1,000 mg total) by mouth 2 (two) times daily.  Marland Kitchen. zolpidem (AMBIEN) 5 MG tablet Take 1 tablet (5 mg total) by mouth at bedtime as needed for sleep.  . [DISCONTINUED] zolpidem (AMBIEN) 10 MG tablet Take 1 tablet (10 mg total) by mouth at bedtime as needed for sleep.    EXAM:  BP 110/66  Temp(Src) 98.2 F (36.8 C) (Oral)  Wt 161 lb (73.029 kg)  Body mass index is 26 kg/(m^2).  GENERAL: vitals reviewed and listed above, alert, oriented, appears well hydrated and in no acute distresscongested ocass loose cough  HEENT: atraumatic, conjunctiva  clear, no obvious abnormalities on inspection of external nose and ears   NECK: no obvious masses on inspection palpation  LUNGS: clear to auscultation bilaterally, no wheezes, rales or rhonchi, good air movement no point cw tenderness  CV: HRRR, no clubbing cyanosis or  peripheral edema  nl cap refill  MS: moves all extremities without noticeable focal  Abnormality Skin large ecchymosis contusion right lateral hip  But gait about normal  Can stand on either leg  No groin pain .  PSYCH: pleasant and cooperative, no obvious depression or anxiety  ASSESSMENT AND PLAN:  Discussed the following assessment and plan:  Contusion, hip, right, initial encounter  Fall, initial encounter  Viral upper respiratory tract infection with cough Note for work.  -Patient advised to return or notify health care team  if symptoms worsen ,persist or new concerns arise.  Expectant management. Low threshold to recheck. Disc flu vaccine  . We don't have latex free and the adult flu vaccination have her call the health department or elsewhere who might have latex free single units. Patient Instructions  Fall avoidance .  Chest is clear .  Cold or ice lot   area of bruise that will  Get better with time over 1-2 weeks   But if   persistent or progressive problem then fu .fever cant walk etc.  Contusion A contusion is a deep bruise. Contusions are the result of an injury that caused bleeding under the skin. The contusion may turn blue, purple, or yellow. Minor injuries will give you a painless contusion, but more severe contusions may stay painful and swollen for a few weeks.  CAUSES  A contusion is usually caused by a blow, trauma, or direct force to an area of the body. SYMPTOMS   Swelling and redness of the injured area.  Bruising of the injured area.  Tenderness and soreness of the injured area.  Pain. DIAGNOSIS  The diagnosis can be made by taking a history and physical exam. An X-ray, CT scan, or MRI may be needed to determine if there were any associated injuries, such as fractures. TREATMENT  Specific treatment will depend on what area of the body was injured. In general, the best treatment for a contusion is resting, icing, elevating, and applying cold compresses to the injured area. Over-the-counter medicines may also be recommended for pain control. Ask your caregiver what the best treatment is for your contusion. HOME CARE INSTRUCTIONS   Put ice on the injured area.  Put ice in a plastic bag.  Place a towel between your skin and the bag.  Leave the ice on for 15-20 minutes, 3-4 times a day, or as directed by your health care provider.  Only take over-the-counter or prescription medicines for pain, discomfort, or fever as directed by your caregiver. Your caregiver may recommend avoiding anti-inflammatory medicines (aspirin, ibuprofen, and naproxen) for 48 hours because these medicines may increase bruising.  Rest the injured area.  If possible, elevate the injured area to reduce swelling. SEEK IMMEDIATE MEDICAL CARE IF:   You have increased bruising or swelling.  You have pain that is getting worse.  Your swelling or pain is not relieved with medicines. MAKE SURE YOU:   Understand these  instructions.  Will watch your condition.  Will get help right away if you are not doing well or get worse. Document Released: 02/26/2005 Document Revised: 05/24/2013 Document Reviewed: 03/24/2011 [facility] Patient Information 2015 [place], [place]. This information is not intended to replace advice given to you by your health care provider. Make sure you discuss any questions you have with your health care provider.    Neta MendsWanda K. Jaskaran Dauzat M.D.

## 2014-03-27 NOTE — Patient Instructions (Addendum)
Fall avoidance .  Chest is clear .  Cold or ice lot   area of bruise that will  Get better with time over 1-2 weeks   But if  persistent or progressive problem then fu .fever cant walk etc.  Contusion A contusion is a deep bruise. Contusions are the result of an injury that caused bleeding under the skin. The contusion may turn blue, purple, or yellow. Minor injuries will give you a painless contusion, but more severe contusions may stay painful and swollen for a few weeks.  CAUSES  A contusion is usually caused by a blow, trauma, or direct force to an area of the body. SYMPTOMS   Swelling and redness of the injured area.  Bruising of the injured area.  Tenderness and soreness of the injured area.  Pain. DIAGNOSIS  The diagnosis can be made by taking a history and physical exam. An X-ray, CT scan, or MRI may be needed to determine if there were any associated injuries, such as fractures. TREATMENT  Specific treatment will depend on what area of the body was injured. In general, the best treatment for a contusion is resting, icing, elevating, and applying cold compresses to the injured area. Over-the-counter medicines may also be recommended for pain control. Ask your caregiver what the best treatment is for your contusion. HOME CARE INSTRUCTIONS   Put ice on the injured area.  Put ice in a plastic bag.  Place a towel between your skin and the bag.  Leave the ice on for 15-20 minutes, 3-4 times a day, or as directed by your health care provider.  Only take over-the-counter or prescription medicines for pain, discomfort, or fever as directed by your caregiver. Your caregiver may recommend avoiding anti-inflammatory medicines (aspirin, ibuprofen, and naproxen) for 48 hours because these medicines may increase bruising.  Rest the injured area.  If possible, elevate the injured area to reduce swelling. SEEK IMMEDIATE MEDICAL CARE IF:   You have increased bruising or swelling.  You  have pain that is getting worse.  Your swelling or pain is not relieved with medicines. MAKE SURE YOU:   Understand these instructions.  Will watch your condition.  Will get help right away if you are not doing well or get worse. Document Released: 02/26/2005 Document Revised: 05/24/2013 Document Reviewed: 03/24/2011 [facility] Patient Information 2015 [place], [place]. This information is not intended to replace advice given to you by your health care provider. Make sure you discuss any questions you have with your health care provider.

## 2014-03-28 ENCOUNTER — Telehealth: Payer: Self-pay | Admitting: Internal Medicine

## 2014-03-28 NOTE — Telephone Encounter (Signed)
Patient Information:  Caller Name: Elnita MaxwellCheryl  Phone: [phone]  Patient: Sandra Key, Sandra Key  Gender: Female  DOB: [birth year]  Age: [age] Years  PCP: Berniece AndreasPanosh, Wanda [facility])  Pregnant: No  Office Follow Up:  Does the office need to follow up with this patient?: Yes  Instructions For The Office: Please have Dr. Fabian SharpPanosh or physician review. Patient seen in office yesterday after fall on Saturday. She did not hit her abdomen. She will try home care instructions and call back for questions, changes or concerns.  PLEASE REVIEW.  RN Note:  Please have Dr. Fabian SharpPanosh or physician review. Patient seen in office yesterday after fall on Saturday. She did not hit her abdomen. She will try home care instructions and call back for questions, changes or concerns.  PLEASE REVIEW.  Symptoms  Reason For Call & Symptoms: Patient was in office yesterday 03/27/14 for evaluation by Dr. Fabian SharpPanosh. She fell on Saturday 03/25/14.   She struck her right hip and side.  She reports a new and different discomfort today.  She began having abdominal pain onset 2 hours ago "cramping and back ache".  Location- Lower right quadrant non radiating, "mild cramping".    Last cycle 2 weeks ago.   No n/v/d, no blood in urine or stool.  +flatus. Last BM - today but hard.  Afebrile. Voice is clear and calm  Reviewed Health History In EMR: Yes  Reviewed Medications In EMR: Yes  Reviewed Allergies In EMR: Yes  Reviewed Surgeries / Procedures: Yes  Date of Onset of Symptoms: 03/28/2014  Treatments Tried: Aleve  Treatments Tried Worked: No OB / GYN:  LMP: 03/14/2014  Guideline(s) Used:  Abdominal Pain - Female  Disposition Per Guideline:   Home Care  Reason For Disposition Reached:   Mild abdominal pain  Advice Given:  Reassurance:  A mild stomachache can be caused by indigestion, gas pains or overeating. Sometimes a stomachache signals the onset of a vomiting illness due to a viral gastroenteritis ("stomach flu").  Here  is some care advice that should help.  Rest:  Lie down and rest until you feel better.  Fluids:  Sip clear fluids only (e.g., water, flat soft drinks or 1/2 strength fruit juice) until the pain has been gone for over 2 hours. Then slowly return to a regular diet.  Diet:  Slowly advance diet from clear liquids to a bland diet  Avoid alcohol or caffeinated beverages  Avoid greasy or fatty foods.  Avoid NSAIDS and Aspirin  : Avoid any drug that can irritate the stomach lining and make the pain worse (especially aspirin and NSAIDs like ibuprofen).  Call Back If:  Abdominal pain is constant and present for more than 2 hours  Abdominal pains come and go and are present for more than 24 hours  You are pregnant  You become worse.  RN Overrode Recommendation:  Document Patient  Please have Dr. Fabian SharpPanosh or physician review. Patient seen in office yesterday after fall on Saturday. She did not hit her abdomen. She will try home care instructions and call back for questions, changes or concerns.  PLEASE REVIEW.

## 2014-03-28 NOTE — Telephone Encounter (Signed)
FYI.  Pt will try home care advise and call back if necessary

## 2014-04-03 ENCOUNTER — Encounter: Payer: Self-pay | Admitting: Internal Medicine

## 2014-05-20 ENCOUNTER — Other Ambulatory Visit: Payer: Self-pay | Admitting: Internal Medicine

## 2014-05-22 NOTE — Telephone Encounter (Signed)
Ok to refill x 1  

## 2014-05-22 NOTE — Telephone Encounter (Signed)
Called to the pharmacy and left on machine. 

## 2014-06-01 ENCOUNTER — Ambulatory Visit (HOSPITAL_COMMUNITY): Payer: BC Managed Care – PPO

## 2014-06-08 ENCOUNTER — Ambulatory Visit (HOSPITAL_COMMUNITY): Payer: BC Managed Care – PPO

## 2014-06-08 ENCOUNTER — Ambulatory Visit (HOSPITAL_COMMUNITY)
Admission: RE | Admit: 2014-06-08 | Discharge: 2014-06-08 | Disposition: A | Discharge status: Home / Self Care | Payer: BC Managed Care – PPO | Source: Ambulatory Visit | Attending: Internal Medicine | Admitting: Internal Medicine

## 2014-06-08 DIAGNOSIS — Z1231 Encounter for screening mammogram for malignant neoplasm of breast: Secondary | ICD-10-CM | POA: Insufficient documentation

## 2014-08-03 ENCOUNTER — Ambulatory Visit: Payer: BC Managed Care – PPO | Admitting: Family Medicine

## 2014-08-03 ENCOUNTER — Ambulatory Visit (INDEPENDENT_AMBULATORY_CARE_PROVIDER_SITE_OTHER): Payer: BC Managed Care – PPO | Admitting: Family Medicine

## 2014-08-03 ENCOUNTER — Encounter: Payer: Self-pay | Admitting: Family Medicine

## 2014-08-03 VITALS — BP 118/80 | HR 87 | Temp 99.6°F | Wt 169.0 lb

## 2014-08-03 DIAGNOSIS — B349 Viral infection, unspecified: Secondary | ICD-10-CM

## 2014-08-03 MED ORDER — BENZONATATE 200 MG PO CAPS: 200.0000 mg | ORAL_CAPSULE | Freq: Three times a day (TID) | ORAL | Status: DC | PRN

## 2014-08-03 NOTE — Progress Notes (Signed)
   Subjective:    Patient ID: Sandra Key, female    DOB: 07/04/1970, 44 y.o.   MRN: [id number]  HPI Acute visit. Patient developed about 2-3 days ago cough, minimal nasal congestion, chills without documented fever, mild headaches, malaise. Denies any nausea or vomiting. Cough is been mostly nonproductive. 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 Generally very healthy. Nonsmoker. No history of asthma.  Past Medical History  Diagnosis Date  . Allergic rhinitis     History of allergy testing positive  . Asthma   . Recurrent pregnancy loss   . Tinnitus, right     Had mri  and eustachian tube dysfunction   Past Surgical History  Procedure Laterality Date  . Cesarean section    . Breast reduction surgery    . Tympanostomy tube placement  2009    right      reports that she has never smoked. She does not have any smokeless tobacco history on file. She reports that she drinks alcohol. She reports that she does not use illicit drugs. family history is not on file. Allergies  Allergen Reactions  . Latex Rash  . Codeine Phosphate     REACTION: unspecified  . Hydrocodone   . Peanut-Containing Drug Products   . Shellfish Allergy       Review of Systems  Constitutional: Positive for fever, chills and fatigue.  HENT: Positive for congestion and sore throat.   Respiratory: Positive for cough. Negative for wheezing.   Gastrointestinal: Negative for nausea and vomiting.  Neurological: Positive for headaches.       Objective:   Physical Exam  Constitutional: She appears well-developed and well-nourished. No distress.  HENT:  Right Ear: External ear normal.  Left Ear: External ear normal.  Mouth/Throat: Oropharynx is clear and moist.  Neck: Neck supple.  Cardiovascular: Normal rate and regular rhythm.   Pulmonary/Chest: Effort normal and breath sounds normal. No respiratory distress. She has no wheezes. She has no rales.    Lymphadenopathy:    She has no cervical adenopathy.          Assessment & Plan:  Viral syndrome. Doubt influenza. Tessalon Perles 200 mg every 8 hours as need for cough. Continue over-the-counter medications such as Advil for symptom relief. Follow-up as needed

## 2014-08-03 NOTE — Progress Notes (Signed)
Pre visit review using our clinic review tool, if applicable. No additional management support is needed unless otherwise documented below in the visit note. 

## 2014-08-03 NOTE — Patient Instructions (Signed)

## 2014-08-16 ENCOUNTER — Telehealth: Payer: Self-pay | Admitting: Internal Medicine

## 2014-08-16 NOTE — Telephone Encounter (Signed)
Ask Dr. Fabian SharpPanosh about this

## 2014-08-16 NOTE — Telephone Encounter (Signed)
Pt request refill of the following: phentermine 30 MG capsule   [facility] [address]

## 2014-08-21 NOTE — Telephone Encounter (Signed)
Pt has been sch

## 2014-08-21 NOTE — Telephone Encounter (Signed)
Needs OV  See last med check summer  july

## 2014-08-21 NOTE — Telephone Encounter (Signed)
Pt will need to come in for OV to get refill of phentermine.  Please contact the pt and help her make a follow up appt.  Thanks!

## 2014-09-01 ENCOUNTER — Encounter: Payer: Self-pay | Admitting: Internal Medicine

## 2014-09-01 ENCOUNTER — Encounter: Payer: Self-pay | Admitting: Family Medicine

## 2014-09-01 ENCOUNTER — Ambulatory Visit (INDEPENDENT_AMBULATORY_CARE_PROVIDER_SITE_OTHER): Payer: BC Managed Care – PPO | Admitting: Internal Medicine

## 2014-09-01 VITALS — BP 90/62 | Temp 98.0°F | Ht 66.0 in | Wt 165.4 lb

## 2014-09-01 DIAGNOSIS — G479 Sleep disorder, unspecified: Secondary | ICD-10-CM

## 2014-09-01 DIAGNOSIS — Z79899 Other long term (current) drug therapy: Secondary | ICD-10-CM

## 2014-09-01 DIAGNOSIS — Z6829 Body mass index (BMI) 29.0-29.9, adult: Secondary | ICD-10-CM

## 2014-09-01 MED ORDER — PHENTERMINE HCL 30 MG PO CAPS: ORAL_CAPSULE | ORAL | Status: DC

## 2014-09-01 NOTE — Progress Notes (Signed)
Pre visit review using our clinic review tool, if applicable. No additional management support is needed unless otherwise documented below in the visit note.  Chief Complaint  Patient presents with  . Follow-up    Would like to re start phentermine.    HPI: Verble Styron Balla 44 y.o. comes fu obestity and med [employer]  Tried off  And   Back at gym.   Last taking it  December.  Off for 3 months.  ocass jitteriness.   Want to try to get back in sync for now .   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 No other  Med issues   Going back to gyme. No tad rare wine  Says utd pap ROS: See pertinent positives and negatives per HPI.  Past Medical History  Diagnosis Date  . Allergic rhinitis     History of allergy testing positive  . Asthma   . Recurrent pregnancy loss   . Tinnitus, right     Had mri  and eustachian tube dysfunction    No family history on file.  History   Social History  . Marital Status: Married    Spouse Name: N/A  . Number of Children: N/A  . Years of Education: N/A   Social History Main Topics  . Smoking status: Never Smoker   . Smokeless tobacco: Not on file  . Alcohol Use: Yes     Comment: occ.  . Drug Use: No  . Sexual Activity: Not on file   Other Topics Concern  . None   Social History Narrative   Separated husband in [place]  Coming back so far ,    Son with autism  One at [employer] one at  [employer] 9th grade.    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    Husband not yet working.  Back up from [place]    Separated    hh of 4 no ets              Outpatient Encounter Prescriptions as of 09/01/2014  Medication Sig  . cetirizine (ZYRTEC) 10 MG tablet Take 10 mg by mouth daily.  . valACYclovir (VALTREX) 1000 MG tablet  Take 1 tablet (1,000 mg total) by mouth 2 (two) times daily.  . phentermine 30 MG capsule TAKE ONE CAPSULE IN THE MORNING  . [DISCONTINUED] benzonatate (TESSALON) 200 MG capsule Take 1 capsule (200 mg total) by mouth 3 (three) times daily as needed.  . [DISCONTINUED] phentermine 30 MG capsule TAKE ONE CAPSULE IN THE MORNING (Patient not taking: Reported on 09/01/2014)  . [DISCONTINUED] zolpidem (AMBIEN) 5 MG tablet Take 1 tablet (5 mg total) by mouth at bedtime as needed for sleep.    EXAM:  BP 90/62 mmHg  Temp(Src) 98 F (36.7 C) (Oral)  Ht  (1.676 m)  Wt 165 lb 6.4 oz (75.025 kg)  BMI 26.71 kg/m2  Body mass index is 26.71 kg/(m^2).  GENERAL: vitals reviewed and listed above, alert, oriented, appears well hydrated and in no acute distress HEENT: atraumatic, conjunctiva  clear, no obvious abnormalities on inspection of external nose and ears NECK: no obvious masses on inspection  palpation  LUNGS: clear to auscultation bilaterally, no wheezes, rales or rhonchi, good air movement CV: HRRR, no clubbing cyanosis or  peripheral edema nl cap refill  Abdomen:  Sof,t normal bowel sounds without hepatosplenomegaly, no guarding rebound or masses  MS: moves all extremities without noticeable focal  abnormality PSYCH: pleasant and cooperative, no obvious depression or anxiety Wt Readings from Last 3 Encounters:  09/01/14 165 lb 6.4 oz (75.025 kg)  08/03/14 169 lb (76.658 kg)  03/27/14 161 lb (73.029 kg)   BP Readings from Last 3 Encounters:  09/01/14 90/62  08/03/14 118/80  03/27/14 110/66   Body mass index is 26.71 kg/(m^2).   ASSESSMENT AND PLAN:  Discussed the following assessment and plan:  Medication management  BMI 29.0-29.9,adult Ok to have med on hand  For maintenance if needed  Caution  Sleep disc   Devices  .  Melatonin  Has done fialry well maintaining weight  Trying to get back into healthier eating .  -Patient advised to return or notify health care team  if  symptoms worsen ,persist or new concerns arise.  Patient Instructions   Continue lifestyle intervention healthy eating and exercise . Pay attention to sleep   Very important for health and weight healthy   Wt Readings from Last 3 Encounters:  09/01/14 165 lb 6.4 oz (75.025 kg)  08/03/14 169 lb (76.658 kg)  03/27/14 161 lb (73.029 kg)    Insomnia Insomnia is frequent trouble falling and/or staying asleep. Insomnia can be a long term problem or a short term problem. Both are common. Insomnia can be a short term problem when the wakefulness is related to a certain stress or worry. Long term insomnia is often related to ongoing stress during waking hours and/or poor sleeping habits. Overtime, sleep deprivation itself can make the problem worse. Every little thing feels more severe because you are overtired and your ability to cope is decreased. CAUSES   Stress, anxiety, and depression.  Poor sleeping habits.  Distractions such as TV in the bedroom.  Naps close to bedtime.  Engaging in emotionally charged conversations before bed.  Technical reading before sleep.  Alcohol and other sedatives. They may make the problem worse. They can hurt normal sleep patterns and normal dream activity.  Stimulants such as caffeine for several hours prior to bedtime.  Pain syndromes and shortness of breath can cause insomnia.  Exercise late at night.  Changing time zones may cause sleeping problems (jet lag). It is sometimes helpful to have someone observe your sleeping patterns. They should look for periods of not breathing during the night (sleep apnea). They should also look to see how long those periods last. If you live alone or observers are uncertain, you can also be observed at a sleep clinic where your sleep patterns will be professionally monitored. Sleep apnea requires a checkup and treatment. Give your caregivers your medical history. Give your caregivers observations your family has made  about your sleep.  SYMPTOMS   Not feeling rested in the morning.  Anxiety and restlessness at bedtime.  Difficulty falling and staying asleep. TREATMENT   Your caregiver may prescribe treatment for an underlying medical disorders. Your caregiver can give advice or help if you are using alcohol or other drugs for self-medication. Treatment of underlying problems will usually eliminate insomnia problems.  Medications can be prescribed for short time use. They are generally not recommended for lengthy use.  Over-the-counter sleep medicines are not recommended for lengthy use. They can be habit  forming.  You can promote easier sleeping by making lifestyle changes such as:  Using relaxation techniques that help with breathing and reduce muscle tension.  Exercising earlier in the day.  Changing your diet and the time of your last meal. No night time snacks.  Establish a regular time to go to bed.  Counseling can help with stressful problems and worry.  Soothing music and white noise may be helpful if there are background noises you cannot remove.  Stop tedious detailed work at least one hour before bedtime. HOME CARE INSTRUCTIONS   Keep a diary. Inform your caregiver about your progress. This includes any medication side effects. See your caregiver regularly. Take note of:  Times when you are asleep.  Times when you are awake during the night.  The quality of your sleep.  How you feel the next day. This information will help your caregiver care for you.  Get out of bed if you are still awake after 15 minutes. Read or do some quiet activity. Keep the lights down. Wait until you feel sleepy and go back to bed.  Keep regular sleeping and waking hours. Avoid naps.  Exercise regularly.  Avoid distractions at bedtime. Distractions include watching television or engaging in any intense or detailed activity like attempting to balance the household checkbook.  Develop a bedtime  ritual. Keep a familiar routine of bathing, brushing your teeth, climbing into bed at the same time each night, listening to soothing music. Routines increase the success of falling to sleep faster.  Use relaxation techniques. This can be using breathing and muscle tension release routines. It can also include visualizing peaceful scenes. You can also help control troubling or intruding thoughts by keeping your mind occupied with boring or repetitive thoughts like the old concept of counting sheep. You can make it more creative like imagining planting one beautiful flower after another in your backyard garden.  During your day, work to eliminate stress. When this is not possible use some of the previous suggestions to help reduce the anxiety that accompanies stressful situations. MAKE SURE YOU:   Understand these instructions.  Will watch your condition.  Will get help right away if you are not doing well or get worse. Document Released: 05/16/2000 Document Revised: 08/11/2011 Document Reviewed: 06/16/2007 [facility] Patient Information 2015 [place], [place]. This information is not intended to replace advice given to you by your health care provider. Make sure you discuss any questions you have with your health care provider.       Neta MendsWanda K. Panosh M.D.

## 2014-09-01 NOTE — Patient Instructions (Signed)
Continue lifestyle intervention healthy eating and exercise . Pay attention to sleep   Very important for health and weight healthy   Wt Readings from Last 3 Encounters:  09/01/14 165 lb 6.4 oz (75.025 kg)  08/03/14 169 lb (76.658 kg)  03/27/14 161 lb (73.029 kg)    Insomnia Insomnia is frequent trouble falling and/or staying asleep. Insomnia can be a long term problem or a short term problem. Both are common. Insomnia can be a short term problem when the wakefulness is related to a certain stress or worry. Long term insomnia is often related to ongoing stress during waking hours and/or poor sleeping habits. Overtime, sleep deprivation itself can make the problem worse. Every little thing feels more severe because you are overtired and your ability to cope is decreased. CAUSES   Stress, anxiety, and depression.  Poor sleeping habits.  Distractions such as TV in the bedroom.  Naps close to bedtime.  Engaging in emotionally charged conversations before bed.  Technical reading before sleep.  Alcohol and other sedatives. They may make the problem worse. They can hurt normal sleep patterns and normal dream activity.  Stimulants such as caffeine for several hours prior to bedtime.  Pain syndromes and shortness of breath can cause insomnia.  Exercise late at night.  Changing time zones may cause sleeping problems (jet lag). It is sometimes helpful to have someone observe your sleeping patterns. They should look for periods of not breathing during the night (sleep apnea). They should also look to see how long those periods last. If you live alone or observers are uncertain, you can also be observed at a sleep clinic where your sleep patterns will be professionally monitored. Sleep apnea requires a checkup and treatment. Give your caregivers your medical history. Give your caregivers observations your family has made about your sleep.  SYMPTOMS   Not feeling rested in the  morning.  Anxiety and restlessness at bedtime.  Difficulty falling and staying asleep. TREATMENT   Your caregiver may prescribe treatment for an underlying medical disorders. Your caregiver can give advice or help if you are using alcohol or other drugs for self-medication. Treatment of underlying problems will usually eliminate insomnia problems.  Medications can be prescribed for short time use. They are generally not recommended for lengthy use.  Over-the-counter sleep medicines are not recommended for lengthy use. They can be habit forming.  You can promote easier sleeping by making lifestyle changes such as:  Using relaxation techniques that help with breathing and reduce muscle tension.  Exercising earlier in the day.  Changing your diet and the time of your last meal. No night time snacks.  Establish a regular time to go to bed.  Counseling can help with stressful problems and worry.  Soothing music and white noise may be helpful if there are background noises you cannot remove.  Stop tedious detailed work at least one hour before bedtime. HOME CARE INSTRUCTIONS   Keep a diary. Inform your caregiver about your progress. This includes any medication side effects. See your caregiver regularly. Take note of:  Times when you are asleep.  Times when you are awake during the night.  The quality of your sleep.  How you feel the next day. This information will help your caregiver care for you.  Get out of bed if you are still awake after 15 minutes. Read or do some quiet activity. Keep the lights down. Wait until you feel sleepy and go back to bed.  Keep regular sleeping  and waking hours. Avoid naps.  Exercise regularly.  Avoid distractions at bedtime. Distractions include watching television or engaging in any intense or detailed activity like attempting to balance the household checkbook.  Develop a bedtime ritual. Keep a familiar routine of bathing, brushing your  teeth, climbing into bed at the same time each night, listening to soothing music. Routines increase the success of falling to sleep faster.  Use relaxation techniques. This can be using breathing and muscle tension release routines. It can also include visualizing peaceful scenes. You can also help control troubling or intruding thoughts by keeping your mind occupied with boring or repetitive thoughts like the old concept of counting sheep. You can make it more creative like imagining planting one beautiful flower after another in your backyard garden.  During your day, work to eliminate stress. When this is not possible use some of the previous suggestions to help reduce the anxiety that accompanies stressful situations. MAKE SURE YOU:   Understand these instructions.  Will watch your condition.  Will get help right away if you are not doing well or get worse. Document Released: 05/16/2000 Document Revised: 08/11/2011 Document Reviewed: 06/16/2007 [facility] Patient Information 2015 [place], [place]. This information is not intended to replace advice given to you by your health care provider. Make sure you discuss any questions you have with your health care provider.

## 2014-09-25 ENCOUNTER — Telehealth: Payer: Self-pay | Admitting: Internal Medicine

## 2014-09-25 NOTE — Telephone Encounter (Signed)
Patient Name: Sandra Key  DOB: 12/17/1970    Initial Comment Caller States heart has been fluttering for the last 4 days off and on. she has been having some chest pains within the last 2 days. no trouble breathing.   Nurse Assessment  Nurse: Annye Englisharmon, RN, Denise Date/Time (Eastern Time): 09/25/2014 3:04:14 PM  Confirm and document reason for call. If symptomatic, describe symptoms. ---Pt reports she has had heart fluttering x 4 days w/CP but denies SOB. Took herself off of caffiene on Friday and fluttering improved. Cont to have heart flutter today w/CP but cont to deny SOB, dizziness. Denies radiation of CP.  Has the patient traveled out of the country within the last 30 days? ---Not Applicable  Does the patient require triage? ---Yes  Related visit to physician within the last 2 weeks? ---No  Does the PT have any chronic conditions? (i.e. diabetes, asthma, etc.) ---No  Did the patient indicate they were pregnant? ---No     Guidelines    Guideline Title Affirmed Question Affirmed Notes  Heart Rate and Heartbeat Questions History of heart disease (i.e., heart attack, bypass surgery, angina, angioplasty, CHF) (Exception: brief heart beat symptoms that went away and now feels well) Hx of CP w/palpitations   Final Disposition User   See Physician within 4 Hours (or PCP triage) Annye Englisharmon, RN, Angelique Blonderenise    Comments  No appts available to be seen at [facility] today within 4 hours. [facility]. [facility].

## 2014-09-25 NOTE — Telephone Encounter (Signed)
FYI

## 2014-09-26 ENCOUNTER — Ambulatory Visit (INDEPENDENT_AMBULATORY_CARE_PROVIDER_SITE_OTHER): Payer: BC Managed Care – PPO | Admitting: Family Medicine

## 2014-09-26 ENCOUNTER — Encounter: Payer: Self-pay | Admitting: Family Medicine

## 2014-09-26 VITALS — BP 121/74 | HR 72 | Temp 98.7°F | Ht 66.0 in | Wt 168.0 lb

## 2014-09-26 DIAGNOSIS — R002 Palpitations: Secondary | ICD-10-CM

## 2014-09-26 LAB — CBC WITH DIFFERENTIAL/PLATELET
BASOS PCT: 0.6 % (ref 0.0–3.0)
Basophils Absolute: 0 10*3/uL (ref 0.0–0.1)
EOS ABS: 0.1 10*3/uL (ref 0.0–0.7)
Eosinophils Relative: 2.6 % (ref 0.0–5.0)
HEMATOCRIT: 34.2 % — AB (ref 36.0–46.0)
HEMOGLOBIN: 11.2 g/dL — AB (ref 12.0–15.0)
Lymphocytes Relative: 33.9 % (ref 12.0–46.0)
Lymphs Abs: 1.8 10*3/uL (ref 0.7–4.0)
MCHC: 32.9 g/dL (ref 30.0–36.0)
MCV: 83 fl (ref 78.0–100.0)
Monocytes Absolute: 0.3 10*3/uL (ref 0.1–1.0)
Monocytes Relative: 4.7 % (ref 3.0–12.0)
NEUTROS ABS: 3.1 10*3/uL (ref 1.4–7.7)
NEUTROS PCT: 58.2 % (ref 43.0–77.0)
PLATELETS: 279 10*3/uL (ref 150.0–400.0)
RBC: 4.12 Mil/uL (ref 3.87–5.11)
RDW: 16 % — ABNORMAL HIGH (ref 11.5–15.5)
WBC: 5.4 10*3/uL (ref 4.0–10.5)

## 2014-09-26 LAB — BASIC METABOLIC PANEL
BUN: 8 mg/dL (ref 6–23)
CHLORIDE: 106 meq/L (ref 96–112)
CO2: 29 meq/L (ref 19–32)
CREATININE: 0.67 mg/dL (ref 0.40–1.20)
Calcium: 9.1 mg/dL (ref 8.4–10.5)
GFR: 123.15 mL/min (ref >60.00)
GLUCOSE: 95 mg/dL (ref 70–99)
POTASSIUM: 4 meq/L (ref 3.5–5.1)
SODIUM: 139 meq/L (ref 135–145)

## 2014-09-26 LAB — TSH: TSH: 1.25 u[IU]/mL (ref 0.35–4.50)

## 2014-09-26 NOTE — Telephone Encounter (Signed)
Seen by Dr. Clent RidgesFry 09/26/14

## 2014-09-26 NOTE — Progress Notes (Signed)
Pre visit review using our clinic review tool, if applicable. No additional management support is needed unless otherwise documented below in the visit note. 

## 2014-09-26 NOTE — Progress Notes (Signed)
   Subjective:    Patient ID: Sandra Key, female    DOB: Oct 29, 1970, 44 y.o.   MRN: [id number]  HPI Here for 4 days of intermittent fluttering in the chest which she attributes to stress and to too much caffeine. She has been drinking a lot of [employer]ockerMountain Dew sodas recently and she was also taking Phentermine to try to lose weight. She stopped the Phentermine yesterday and so far today she feels fine. No chest pain or SOB. She works out at [employer] gym regularly and never has trouble.    Review of Systems  Constitutional: Negative.   Respiratory: Negative.   Cardiovascular: Positive for palpitations. Negative for chest pain and leg swelling.  Gastrointestinal: Negative.   Endocrine: Negative.        Objective:   Physical Exam  Constitutional: She is oriented to person, place, and time. She appears well-developed and well-nourished. No distress.  Neck: No thyromegaly present.  Cardiovascular: Normal rate, regular rhythm, normal heart sounds and intact distal pulses.   EKG normal   Pulmonary/Chest: Effort normal and breath sounds normal.  Lymphadenopathy:    She has no cervical adenopathy.  Neurological: She is alert and oriented to person, place, and time.          Assessment & Plan:  It is likely she is having side effects of a combination of Phentermine and too much caffeine. She will stop the Phentermine and reduce her caffeine intake. Screen some labs today as well.

## 2014-10-02 ENCOUNTER — Telehealth: Payer: Self-pay | Admitting: Internal Medicine

## 2014-10-02 DIAGNOSIS — R002 Palpitations: Secondary | ICD-10-CM

## 2014-10-02 NOTE — Telephone Encounter (Signed)
Would like a referral to see a cardiologist.  She saw Dr. Clent RidgesFry on 09/26/14 and she states her heart was "fluttering".

## 2014-10-02 NOTE — Telephone Encounter (Signed)
Referral was done  

## 2014-11-06 ENCOUNTER — Ambulatory Visit (INDEPENDENT_AMBULATORY_CARE_PROVIDER_SITE_OTHER): Payer: BC Managed Care – PPO | Admitting: Internal Medicine

## 2014-11-06 ENCOUNTER — Ambulatory Visit: Payer: BC Managed Care – PPO | Admitting: Internal Medicine

## 2014-11-06 ENCOUNTER — Encounter: Payer: Self-pay | Admitting: Family Medicine

## 2014-11-06 ENCOUNTER — Encounter: Payer: Self-pay | Admitting: Internal Medicine

## 2014-11-06 VITALS — BP 116/72 | Temp 98.7°F | Ht 66.0 in | Wt 164.1 lb

## 2014-11-06 DIAGNOSIS — M542 Cervicalgia: Secondary | ICD-10-CM | POA: Diagnosis not present

## 2014-11-06 DIAGNOSIS — M5481 Occipital neuralgia: Secondary | ICD-10-CM

## 2014-11-06 DIAGNOSIS — J309 Allergic rhinitis, unspecified: Secondary | ICD-10-CM | POA: Diagnosis not present

## 2014-11-06 MED ORDER — NAPROXEN 500 MG PO TABS: 500.0000 mg | ORAL_TABLET | Freq: Two times a day (BID) | ORAL | Status: DC

## 2014-11-06 NOTE — Progress Notes (Signed)
Pre visit review using our clinic review tool, if applicable. No additional management support is needed unless otherwise documented below in the visit note.  Chief Complaint  Patient presents with  . Left Ear Pain    Started 5 days ago.  Marland Kitchen. Headache    HPI: Patient Sandra Key  comes in today for SDA for  new problem evaluation. Has had 5 days of symptoms that is bothering her with pain at the left occiput near with the neck muscles attach it radiates up to the back of her head behind her ear slightly area to the temple. Wonders if it could be a sinus infection or an ear problem no fever no face pain is taking and histamine.  Took tylenol and motrion .  One dose apiece no significant relief. She also has some tinnitus ringing in her right ear of note she's had this before long was eustachian tube dysfunction and an MRI of her head. Denies any head trauma concussion unusual neck motion. ROS: See pertinent positives and negatives per HPI. No vision changes teeth pain fever neurologic symptoms extremity pain or weakness   Past Medical History  Diagnosis Date  . Allergic rhinitis     History of allergy testing positive  . Asthma   . Recurrent pregnancy loss   . Tinnitus, right     Had mri  and eustachian tube dysfunction    No family history on file.  History   Social History  . Marital Status: Married    Spouse Name: N/A  . Number of Children: N/A  . Years of Education: N/A   Social History Main Topics  . Smoking status: Never Smoker   . Smokeless tobacco: Never Used  . Alcohol Use: 0.0 oz/week    0 Standard drinks or equivalent per week     Comment: occ.  . Drug Use: No  . Sexual Activity: Not on file   Other Topics Concern  . None   Social History Narrative   [place] in [place]  Coming back so far ,    Son with autism  One at [employer] one at  [employer] 9th grade.    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    Husband not yet working.  Back up from [place]    Separated    hh of 4 no ets              Outpatient Prescriptions Prior to Visit  Medication Sig Dispense Refill  . cetirizine (ZYRTEC) 10 MG tablet Take 10 mg by mouth daily.    . valACYclovir (VALTREX) 1000 MG tablet Take 1 tablet (1,000 mg total) by mouth 2 (two) times daily. 60 tablet 0  . phentermine 30 MG capsule TAKE ONE CAPSULE IN THE MORNING (Patient not taking: Reported on 09/26/2014) 30 capsule 0   No facility-administered medications prior to visit.     EXAM:  BP 116/72 mmHg  Temp(Src) 98.7 F (37.1 C) (Oral)  Ht 5\' 6"  (1.676 m)  Wt 164 lb 1.6 oz (74.435 kg)  BMI 26.50 kg/m2  Body mass index is 26.5 kg/(m^2).  GENERAL: vitals reviewed and listed above, alert, oriented, appears well hydrated and in no  acute distress minimally congested allergic appearing HEENT: atraumatic, conjunctiva  clear, no obvious abnormalities on inspection of external nose and ears TMs are within normal limits EACs clear negative TMJ pain OP : no lesion edema or exudate  NECK: no obvious masses on inspection palpation  discomfort left paracervical muscle insertion at occiput no adenopathy is felt. No rash in this distribution no masses. No midline tenderness adequate range of motion MS: moves all extremities without noticeable focal  abnormality no weakness upper extremity. Neurologic grossly normal skin some mild erythema like a prickly heat rash in the anterior upper chest someone left neck but no vesicles or lesions PSYCH: pleasant and cooperative, no obvious depression or anxiety  ASSESSMENT AND PLAN:  Discussed the following assessment and plan:  Occipital neuralgia of left side - with head pain radiating reassuring exam   Neck pain on left side  Allergic rhinitis, unspecified  allergic rhinitis type - not causing current pain   is not seem like sinusitis or allergies although she does have of those symptoms would have her add the nasal cortisone using T inflammatory's in the short run and follow-up if persistent progressive. No other alarm symptoms at this time.  -Patient advised to return or notify health care team  if symptoms worsen ,persist or new concerns arise.  Patient Instructions  Your ear is normal  This may be coming from  The neck and nerve   Irritation . The area of painThat radiates into side of head  No evidence of sinus  infection Take   antiinflammatory for a week and then as needed . This should get better   Pay attention to neck hygiene.  Try to  Optimize your sleep .   Can add nasal  Cortisone such as  Flonase or Nasacort for allergy sx   ROV in rash  Or not better in 1-2 weeks      Kaileigh Viswanathan K. Arun Herrod M.D.

## 2014-11-06 NOTE — Patient Instructions (Addendum)
Your ear is normal  This may be coming from  The neck and nerve   Irritation . The area of painThat radiates into side of head  No evidence of sinus  infection Take   antiinflammatory for a week and then as needed . This should get better   Pay attention to neck hygiene.  Try to  Optimize your sleep .   Can add nasal  Cortisone such as  Flonase or Nasacort for allergy sx   ROV in rash  Or not better in 1-2 weeks

## 2014-11-22 ENCOUNTER — Telehealth: Payer: Self-pay | Admitting: Internal Medicine

## 2014-11-22 NOTE — Telephone Encounter (Signed)
Please schedule

## 2014-11-22 NOTE — Telephone Encounter (Signed)
Pt has been scheduled.  °

## 2014-11-22 NOTE — Telephone Encounter (Signed)
Pt has knot on the back of he head and a female issue, and would like the 3 pm appt on Fri which is a Same day. Is it ok to schedule.  No other appts avail

## 2014-11-24 ENCOUNTER — Ambulatory Visit (INDEPENDENT_AMBULATORY_CARE_PROVIDER_SITE_OTHER): Payer: BC Managed Care – PPO | Admitting: Internal Medicine

## 2014-11-24 ENCOUNTER — Other Ambulatory Visit (HOSPITAL_COMMUNITY)
Admission: RE | Admit: 2014-11-24 | Discharge: 2014-11-24 | Disposition: A | Discharge status: Home / Self Care | Payer: BC Managed Care – PPO | Source: Ambulatory Visit | Attending: Internal Medicine | Admitting: Internal Medicine

## 2014-11-24 ENCOUNTER — Encounter: Payer: Self-pay | Admitting: Internal Medicine

## 2014-11-24 VITALS — BP 116/80 | Temp 98.3°F | Ht 66.0 in | Wt 164.4 lb

## 2014-11-24 DIAGNOSIS — Z113 Encounter for screening for infections with a predominantly sexual mode of transmission: Secondary | ICD-10-CM | POA: Insufficient documentation

## 2014-11-24 DIAGNOSIS — R229 Localized swelling, mass and lump, unspecified: Secondary | ICD-10-CM

## 2014-11-24 DIAGNOSIS — IMO0002 Reserved for concepts with insufficient information to code with codable children: Secondary | ICD-10-CM

## 2014-11-24 DIAGNOSIS — N941 Dyspareunia: Secondary | ICD-10-CM

## 2014-11-24 DIAGNOSIS — N760 Acute vaginitis: Secondary | ICD-10-CM

## 2014-11-24 DIAGNOSIS — Z79899 Other long term (current) drug therapy: Secondary | ICD-10-CM

## 2014-11-24 MED ORDER — METRONIDAZOLE 500 MG PO TABS: 500.0000 mg | ORAL_TABLET | Freq: Two times a day (BID) | ORAL | Status: DC

## 2014-11-24 MED ORDER — PHENTERMINE HCL 30 MG PO CAPS: ORAL_CAPSULE | ORAL | Status: DC

## 2014-11-24 NOTE — Patient Instructions (Addendum)
THIS IS PROBABLY  Bacterial vagenosis  .  empriric treatment .  Bacterial Vaginosis Bacterial vaginosis is a vaginal infection that occurs when the normal balance of bacteria in the vagina is disrupted. It results from an overgrowth of certain bacteria. This is the most common vaginal infection in women of childbearing age. Treatment is important to prevent complications, especially in pregnant women, as it can cause a premature delivery. CAUSES  Bacterial vaginosis is caused by an increase in harmful bacteria that are normally present in smaller amounts in the vagina. Several different kinds of bacteria can cause bacterial vaginosis. However, the reason that the condition develops is not fully understood. RISK FACTORS Certain activities or behaviors can put you at an increased risk of developing bacterial vaginosis, including:  Having a new sex partner or multiple sex partners.  Douching.  Using an intrauterine device (IUD) for contraception. Women do not get bacterial vaginosis from toilet seats, bedding, swimming pools, or contact with objects around them. SIGNS AND SYMPTOMS  Some women with bacterial vaginosis have no signs or symptoms. Common symptoms include:  Grey vaginal discharge.  A fishlike odor with discharge, especially after sexual intercourse.  Itching or burning of the vagina and vulva.  Burning or pain with urination. DIAGNOSIS  Your health care provider will take a medical history and examine the vagina for signs of bacterial vaginosis. A sample of vaginal fluid may be taken. Your health care provider will look at this sample under a microscope to check for bacteria and abnormal cells. A vaginal pH test may also be done.  TREATMENT  Bacterial vaginosis may be treated with antibiotic medicines. These may be given in the form of a pill or a vaginal cream. A second round of antibiotics may be prescribed if the condition comes back after treatment.  HOME CARE INSTRUCTIONS    Only take over-the-counter or prescription medicines as directed by your health care provider.  If antibiotic medicine was prescribed, take it as directed. Make sure you finish it even if you start to feel better.  Do not have sex until treatment is completed.  Tell all sexual partners that you have a vaginal infection. They should see their health care provider and be treated if they have problems, such as a mild rash or itching.  Practice safe sex by using condoms and only having one sex partner. SEEK MEDICAL CARE IF:   Your symptoms are not improving after 3 days of treatment.  You have increased discharge or pain.  You have a fever. MAKE SURE YOU:   Understand these instructions.  Will watch your condition.  Will get help right away if you are not doing well or get worse. FOR MORE INFORMATION  Centers for Disease Control and Prevention, Division of STD Prevention: SolutionApps.co.za American Sexual Health Association (ASHA): www.ashastd.org  Document Released: 05/19/2005 Document Revised: 03/09/2013 Document Reviewed: 12/29/2012 [facility] Patient Information 2015 [place], [place]. This information is not intended to replace advice given to you by your health care provider. Make sure you discuss any questions you have with your health care provider.   Will  Do referral to gyne about the dyspareunia

## 2014-11-24 NOTE — Progress Notes (Signed)
Pre visit review using our clinic review tool, if applicable. No additional management support is needed unless otherwise documented below in the visit note.  Chief Complaint  Patient presents with  . Vaginal Discharge    Also has a know on the back of her head.  . Vaginal Odor    HPI: Patient Sandra Key  comes in today for SDA for  new problem evaluation. Actually has a few issues today.  1. 2 days of  Lump back oif head.  tendder getting better . No trauma worried about a lymph node has a family history lymphoma #2 has had vaginal discharge and odor off and on for couple months. One partner intermittent no condoms ex husband Last pap 2014 neg  Hpv.  #3 has what she calls vaginal dryness but also deep abdominal pelvic dyspareunia. No fever. #4 had stopped the phentermine because of anxiety and coffee but wants to go back on it for one more month seems to be doing well.   ROS: See pertinent positives and negatives per HPI. No chest pain shortness of breath at this time  Past Medical History  Diagnosis Date  . Allergic rhinitis     History of allergy testing positive  . Asthma   . Recurrent pregnancy loss   . Tinnitus, right     Had mri  and eustachian tube dysfunction    No family history on file.  History   Social History  . Marital Status: Married    Spouse Name: N/A  . Number of Children: N/A  . Years of Education: N/A   Social History Main Topics  . Smoking status: Never Smoker   . Smokeless tobacco: Never Used  . Alcohol Use: 0.0 oz/week    0 Standard drinks or equivalent per week     Comment: occ.  . Drug Use: No  . Sexual Activity: Not on file   Other Topics Concern  . None   Social History Narrative   Separated husband in [place]  Coming back so far ,    Son with autism  One at [place] one at  [employer] 9th grade.    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    Husband not yet working.  Back up from [place]    Separated    hh of 4 no ets              Outpatient Prescriptions Prior to Visit  Medication Sig Dispense Refill  . cetirizine (ZYRTEC) 10 MG tablet Take 10 mg by mouth daily.    . naproxen (NAPROSYN) 500 MG tablet Take 1 tablet (500 mg total) by mouth 2 (two) times daily with a meal. 40 tablet 0  . valACYclovir (VALTREX) 1000 MG tablet Take 1 tablet (1,000 mg total) by mouth 2 (two) times daily. 60 tablet 0  . phentermine 30 MG capsule TAKE ONE CAPSULE IN THE MORNING 30 capsule 0   No facility-administered medications prior to visit.     EXAM:  BP 116/80 mmHg  Temp(Src) 98.3 F (36.8 C) (Oral)  Ht  (1.676 m)  Wt 164 lb 6.4 oz (74.571 kg)  BMI 26.55 kg/m2  Body mass index is 26.55  kg/(m^2).  GENERAL: vitals reviewed and listed above, alert, oriented, appears well hydrated and in no acute distress HEENT: atraumatic on the back occipital area of the scalp to the right there is a very small seed sized nodule that is not visible but palpable only faint erythema no centered no fluctuance. It is mobile., conjunctiva  clear, no obvious abnormalities on inspection of external nose and ears NECK: no obvious masses on inspection palpation  MS: moves all extremities without noticeable focal  Abnormality External GU no lesions gray white discharge homogeneous. Cervix os clear without pus +1 vaginal discharge no clumsiness. Sent for GC chlamydia to trich Candida and BV PSYCH: pleasant and cooperative, no obvious depression or anxiety Lab Results  Component Value Date   WBC 5.4 09/26/2014   HGB 11.2* 09/26/2014   HCT 34.2* 09/26/2014   PLT 279.0 09/26/2014   GLUCOSE 95 09/26/2014   CHOL 166 12/14/2013   TRIG 95.0 12/14/2013   HDL 74.90 12/14/2013   LDLCALC 72 12/14/2013   ALT 17 12/14/2013   AST 25  12/14/2013   NA 139 09/26/2014   K 4.0 09/26/2014   CL 106 09/26/2014   CREATININE 0.67 09/26/2014   BUN 8 09/26/2014   CO2 29 09/26/2014   TSH 1.25 09/26/2014   Wt Readings from Last 3 Encounters:  11/24/14 164 lb 6.4 oz (74.571 kg)  11/06/14 164 lb 1.6 oz (74.435 kg)  09/26/14 168 lb (76.204 kg)    ASSESSMENT AND PLAN:  Discussed the following assessment and plan:  Vaginitis and vulvovaginitis - Plan: Cervicovaginal ancillary only  Dyspareunia - Plan: Cervicovaginal ancillary only  Lump - SCALP NODULE  PROB CYST VS MILD INFECTION - Plan: Cervicovaginal ancillary only  Medication management - ok to refill phentermine.  x 1 month risk aware Possible BV. Treatment pending labs. Not concerned about the lump on the scalp probably a small cyst versus a reactive node that is so small but is not significant follow-up if progressive  Discuss Celedonio Savage causes of dyspareunia treat vaginal infection. Refer to GYN. -Patient advised to return or notify health care team  if symptoms worsen ,persist or new concerns arise.  Patient Instructions  THIS IS PROBABLY  Bacterial vagenosis  .  empriric treatment .  Bacterial Vaginosis Bacterial vaginosis is a vaginal infection that occurs when the normal balance of bacteria in the vagina is disrupted. It results from an overgrowth of certain bacteria. This is the most common vaginal infection in women of childbearing age. Treatment is important to prevent complications, especially in pregnant women, as it can cause a premature delivery. CAUSES  Bacterial vaginosis is caused by an increase in harmful bacteria that are normally present in smaller amounts in the vagina. Several different kinds of bacteria can cause bacterial vaginosis. However, the reason that the condition develops is not fully understood. RISK FACTORS Certain activities or behaviors can put you at an increased risk of developing bacterial vaginosis, including:  Having a new sex  partner or multiple sex partners.  Douching.  Using an intrauterine device (IUD) for contraception. Women do not get bacterial vaginosis from toilet seats, bedding, swimming pools, or contact with objects around them. SIGNS AND SYMPTOMS  Some women with bacterial vaginosis have no signs or symptoms. Common symptoms include:  Grey vaginal discharge.  A fishlike odor with discharge, especially after sexual intercourse.  Itching or burning of the vagina and vulva.  Burning or pain with urination. DIAGNOSIS  Your health care provider will take a medical history  and examine the vagina for signs of bacterial vaginosis. A sample of vaginal fluid may be taken. Your health care provider will look at this sample under a microscope to check for bacteria and abnormal cells. A vaginal pH test may also be done.  TREATMENT  Bacterial vaginosis may be treated with antibiotic medicines. These may be given in the form of a pill or a vaginal cream. A second round of antibiotics may be prescribed if the condition comes back after treatment.  HOME CARE INSTRUCTIONS   Only take over-the-counter or prescription medicines as directed by your health care provider.  If antibiotic medicine was prescribed, take it as directed. Make sure you finish it even if you start to feel better.  Do not have sex until treatment is completed.  Tell all sexual partners that you have a vaginal infection. They should see their health care provider and be treated if they have problems, such as a mild rash or itching.  Practice safe sex by using condoms and only having one sex partner. SEEK MEDICAL CARE IF:   Your symptoms are not improving after 3 days of treatment.  You have increased discharge or pain.  You have a fever. MAKE SURE YOU:   Understand these instructions.  Will watch your condition.  Will get help right away if you are not doing well or get worse. FOR MORE INFORMATION  Centers for Disease Control and  Prevention, Division of STD Prevention: SolutionApps.co.za American Sexual Health Association (ASHA): www.ashastd.org  Document Released: 05/19/2005 Document Revised: 03/09/2013 Document Reviewed: 12/29/2012 [facility] Patient Information 2015 [place], [place]. This information is not intended to replace advice given to you by your health care provider. Make sure you discuss any questions you have with your health care provider.   Will  Do referral to gyne about the dyspareunia     Neta Mends. Zikeria Keough M.D.

## 2014-11-27 ENCOUNTER — Telehealth: Payer: Self-pay | Admitting: Obstetrics and Gynecology

## 2014-11-27 NOTE — Telephone Encounter (Signed)
Called and left a message for patient to call back to schedule a new patient doctor referral. °

## 2014-11-28 LAB — CERVICOVAGINAL ANCILLARY ONLY
CHLAMYDIA, DNA PROBE: NEGATIVE
NEISSERIA GONORRHEA: NEGATIVE
TRICH (WINDOWPATH): NEGATIVE

## 2014-11-29 ENCOUNTER — Telehealth: Payer: Self-pay | Admitting: Internal Medicine

## 2014-11-29 LAB — CERVICOVAGINAL ANCILLARY ONLY: CANDIDA VAGINITIS: NEGATIVE

## 2014-11-29 NOTE — Telephone Encounter (Signed)
PA for phentermine was denied.  Patient must have BMI greater than or equal to 27 kg/m2.  Patient must pay out of pocket.

## 2014-11-29 NOTE — Telephone Encounter (Signed)
Called and left a message for patient to call back to schedule a new patient doctor referral. °

## 2014-11-29 NOTE — Telephone Encounter (Signed)
LM on home/cell for the pt to return my call 

## 2014-11-30 NOTE — Telephone Encounter (Signed)
Left a message on home/cell for a return call. 

## 2014-11-30 NOTE — Telephone Encounter (Signed)
Pt notified of denial by insurance. 

## 2014-11-30 NOTE — Telephone Encounter (Signed)
Called and left a message for patient to call back to schedule a new patient doctor referral. °

## 2015-01-02 ENCOUNTER — Other Ambulatory Visit (HOSPITAL_COMMUNITY)
Admission: RE | Admit: 2015-01-02 | Discharge: 2015-01-02 | Disposition: A | Discharge status: Home / Self Care | Payer: BC Managed Care – PPO | Source: Ambulatory Visit | Attending: Obstetrics and Gynecology | Admitting: Obstetrics and Gynecology

## 2015-01-02 ENCOUNTER — Other Ambulatory Visit: Payer: Self-pay | Admitting: Obstetrics and Gynecology

## 2015-01-02 DIAGNOSIS — Z01419 Encounter for gynecological examination (general) (routine) without abnormal findings: Secondary | ICD-10-CM | POA: Insufficient documentation

## 2015-01-02 DIAGNOSIS — Z113 Encounter for screening for infections with a predominantly sexual mode of transmission: Secondary | ICD-10-CM | POA: Insufficient documentation

## 2015-01-03 LAB — CYTOLOGY - PAP

## 2015-01-11 ENCOUNTER — Ambulatory Visit: Payer: BC Managed Care – PPO | Admitting: Obstetrics and Gynecology

## 2015-01-12 ENCOUNTER — Telehealth: Payer: Self-pay | Admitting: Family Medicine

## 2015-01-12 NOTE — Telephone Encounter (Signed)
Re routing This message    Please advise patient She may need to make her own appt that she can keep.   Please make sure she knew of appt ( not just a message)   Thanks  WP     ----- Message -----   From: Sharma Covert  \\\: 01/11/2015 11:03 AM  To: Madelin Headings, MD, [place]  Subject: Please reroute referral/patient did not keep*     Greetings,     Please reroute this referral to another office. This patient did not keep her appointment today with Dr. Oscar La. The patient was made aware upon scheduling that if she did not keep her appointment with our office, we would not be able to reschedule her.      Thank you,      Sharma Covert

## 2015-01-12 NOTE — Telephone Encounter (Signed)
LM on home/cell for a return call.

## 2015-01-23 NOTE — Telephone Encounter (Signed)
Spoke to the pt.  She stated that she called Dr. Salli Quarry office and cancelled the appointment because she could not bring her children.  School was not in [place] at that time.  Went to a different gynecologist.  York Spaniel the last name is Richardson Dopp but could not remember the name of the practice.

## 2015-03-05 ENCOUNTER — Telehealth: Payer: Self-pay | Admitting: Internal Medicine

## 2015-03-05 NOTE — Telephone Encounter (Signed)
Ok to refill x 1 disp 30# 

## 2015-03-05 NOTE — Telephone Encounter (Signed)
Pt request refill phentermine 30 MG capsule  Only a month supply. [facility]/ [address]

## 2015-03-06 MED ORDER — PHENTERMINE HCL 30 MG PO CAPS: ORAL_CAPSULE | ORAL | Status: DC

## 2015-03-06 NOTE — Telephone Encounter (Signed)
Called to the pharmacy and left on machine. 

## 2015-04-25 ENCOUNTER — Ambulatory Visit (INDEPENDENT_AMBULATORY_CARE_PROVIDER_SITE_OTHER): Payer: BC Managed Care – PPO | Admitting: Internal Medicine

## 2015-04-25 ENCOUNTER — Encounter: Payer: Self-pay | Admitting: Internal Medicine

## 2015-04-25 VITALS — BP 120/74 | Temp 98.2°F | Ht 65.5 in | Wt 162.1 lb

## 2015-04-25 DIAGNOSIS — Z23 Encounter for immunization: Secondary | ICD-10-CM | POA: Diagnosis not present

## 2015-04-25 DIAGNOSIS — G4700 Insomnia, unspecified: Secondary | ICD-10-CM | POA: Diagnosis not present

## 2015-04-25 DIAGNOSIS — Z0000 Encounter for general adult medical examination without abnormal findings: Secondary | ICD-10-CM | POA: Diagnosis not present

## 2015-04-25 DIAGNOSIS — M79671 Pain in right foot: Secondary | ICD-10-CM | POA: Diagnosis not present

## 2015-04-25 DIAGNOSIS — Z79899 Other long term (current) drug therapy: Secondary | ICD-10-CM

## 2015-04-25 LAB — HEPATIC FUNCTION PANEL
ALK PHOS: 43 U/L (ref 39–117)
ALT: 12 U/L (ref 0–35)
AST: 16 U/L (ref 0–37)
Albumin: 4.1 g/dL (ref 3.5–5.2)
BILIRUBIN DIRECT: 0.1 mg/dL (ref 0.0–0.3)
BILIRUBIN TOTAL: 0.4 mg/dL (ref 0.2–1.2)
Total Protein: 6.8 g/dL (ref 6.0–8.3)

## 2015-04-25 LAB — CBC WITH DIFFERENTIAL/PLATELET
BASOS ABS: 0 10*3/uL (ref 0.0–0.1)
BASOS PCT: 0.5 % (ref 0.0–3.0)
EOS ABS: 0.3 10*3/uL (ref 0.0–0.7)
EOS PCT: 3.3 % (ref 0.0–5.0)
HCT: 36.6 % (ref 36.0–46.0)
Hemoglobin: 11.8 g/dL — ABNORMAL LOW (ref 12.0–15.0)
LYMPHS ABS: 2.3 10*3/uL (ref 0.7–4.0)
LYMPHS PCT: 25 % (ref 12.0–46.0)
MCHC: 32.3 g/dL (ref 30.0–36.0)
MCV: 85 fl (ref 78.0–100.0)
Monocytes Absolute: 0.4 10*3/uL (ref 0.1–1.0)
Monocytes Relative: 4 % (ref 3.0–12.0)
NEUTROS PCT: 67.2 % (ref 43.0–77.0)
Neutro Abs: 6 10*3/uL (ref 1.4–7.7)
PLATELETS: 291 10*3/uL (ref 150.0–400.0)
RBC: 4.31 Mil/uL (ref 3.87–5.11)
RDW: 14.7 % (ref 11.5–15.5)
WBC: 9 10*3/uL (ref 4.0–10.5)

## 2015-04-25 LAB — BASIC METABOLIC PANEL
BUN: 9 mg/dL (ref 6–23)
CHLORIDE: 105 meq/L (ref 96–112)
CO2: 29 mEq/L (ref 19–32)
Calcium: 9.3 mg/dL (ref 8.4–10.5)
Creatinine, Ser: 0.7 mg/dL (ref 0.40–1.20)
GFR: 116.77 mL/min (ref >60.00)
Glucose, Bld: 77 mg/dL (ref 70–99)
POTASSIUM: 4.1 meq/L (ref 3.5–5.1)
SODIUM: 140 meq/L (ref 135–145)

## 2015-04-25 LAB — LIPID PANEL
CHOL/HDL RATIO: 3
Cholesterol: 168 mg/dL (ref 0–200)
HDL: 66.1 mg/dL (ref >39.00)
LDL CALC: 80 mg/dL (ref 0–99)
NONHDL: 102.05
Triglycerides: 108 mg/dL (ref 0.0–149.0)
VLDL: 21.6 mg/dL (ref 0.0–40.0)

## 2015-04-25 LAB — TSH: TSH: 1.12 u[IU]/mL (ref 0.35–4.50)

## 2015-04-25 MED ORDER — PHENTERMINE HCL 30 MG PO CAPS: ORAL_CAPSULE | ORAL | Status: DC

## 2015-04-25 NOTE — Progress Notes (Signed)
Pre visit review using our clinic review tool, if applicable. No additional management support is needed unless otherwise documented below in the visit note.  Chief Complaint  Patient presents with  . Annual Exam    refill phentermine    HPI: Patient  Sandra Key  44 y.o. comes in today for Preventive Health Care visit   peridos 6 days every month slight irregular.  No other bleeding  Taking phentermin on weekends or sometimes pre mentrual for food cravings Right foot pain  Went to [facility] said scar tissue can do elliptical but cant use treadmillat home  . ? Cause   Health Maintenance  Topic Date Due  . INFLUENZA VACCINE  01/01/2016  . TETANUS/TDAP  06/24/2017  . PAP SMEAR  01/01/2018  . HIV Screening  Completed   Health Maintenance Review LIFESTYLE:  Exercise:  Foot flare . Elliptical  Tobacco/ETS: no Alcohol:  Social  Sugar beverages: no Sleep:  otc sleep aid sometimes .  Problematic at times no specific disorder  Drug use: no  ROS:  GEN/ HEENT: No fever, significant weight changes sweats headaches vision problems hearing changes, CV/ PULM; No chest pain shortness of breath cough, syncope,edema  change in exercise tolerance. GI /GU: No adominal pain, vomiting, change in bowel habits. No blood in the stool. No significant GU symptoms. SKIN/HEME: ,no acute skin rashes suspicious lesions or bleeding. No lymphadenopathy, nodules, masses.  NEURO/ PSYCH:  No neurologic signs such as weakness numbness. No depression anxiety. IMM/ Allergy: No unusual infections.  Allergy .   REST of 12 system review negative except as per HPI   Past Medical History  Diagnosis Date  . Allergic rhinitis     History of allergy testing positive  . Asthma   . Recurrent pregnancy loss   . Tinnitus, right     Had mri  and eustachian tube dysfunction    Past Surgical History  Procedure Laterality Date  . Cesarean section    . Breast reduction surgery    . Tympanostomy tube placement  2009     right      No family history on file.  Social History   Social History  . Marital Status: Married    Spouse Name: N/A  . Number of Children: N/A  . Years of Education: N/A   Social History Main Topics  . Smoking status: Never Smoker   . Smokeless tobacco: Never Used  . Alcohol Use: 0.0 oz/week    0 Standard drinks or equivalent per week     Comment: occ.  . Drug Use: No  . Sexual Activity: Not Asked   Other Topics Concern  . None   Social History Narrative   Separated husband in [place]  Coming back so far ,    Son with autism  One at [place] one at  [employer] 9th grade.         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    Husband not yet working.  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 cetirizine (ZYRTEC) 10 MG tablet Take 10 mg by mouth daily.    . naproxen (NAPROSYN) 500 MG tablet Take 1 tablet (500 mg total) by mouth 2 (two) times daily with a meal. 40 tablet 0  . valACYclovir (VALTREX) 1000 MG tablet Take 1 tablet (1,000 mg total) by mouth 2 (two) times daily. 60 tablet 0  . phentermine 30 MG capsule TAKE ONE CAPSULE IN THE MORNING 30 capsule 0  . metroNIDAZOLE (FLAGYL) 500 MG tablet Take 1 tablet (500 mg total) by mouth 2 (two) times daily. 14 tablet 0   No facility-administered medications prior to visit.     EXAM:  BP 120/74 mmHg  Temp(Src) 98.2 F (36.8 C) (Oral)  Ht 5' 5.5" (1.664 m)  Wt 162 lb 1.6 oz (73.528 kg)  BMI 26.55 kg/m2  Body mass index is 26.55 kg/(m^2).  Physical Exam: Vital signs reviewed KDT:OIZT is a well-developed well-nourished alert cooperative    who  appearsr stated age in no acute distress.  HEENT: normocephalic atraumatic , Eyes: PERRL EOM's full, conjunctiva clear, Nares: paten,t no deformity discharge or tenderness., Ears: no deformity EAC's clear TMs with normal landmarks. Mouth: clear OP, no lesions, edema.  Moist mucous membranes. Dentition in adequate repair. NECK: supple without masses, thyromegaly or bruits. CHEST/PULM:  Clear to auscultation and percussion breath sounds equal no wheeze , rales or rhonchi. No chest wall deformities or tenderness.Breast: normal by inspection . No dimpling, discharge, masses, tenderness or discharge  Well healed scars . CV: PMI is nondisplaced, S1 S2 no gallops, murmurs, rubs. Peripheral pulses are full without delay.No JVD .  ABDOMEN: Bowel sounds normal nontender  No guard or rebound, no hepato splenomegal no CVA tenderness.  No hernia. Extremtities:  No clubbing cyanosis or edema, no acute joint swelling or redness no focal atrophy right foot   No swelling top of foot near navicular is are of concern  nv seems ok  NEURO:  Oriented x3, cranial nerves 3-12 appear to be intact, no obvious focal weakness,gait within normal limits no abnormal reflexes or asymmetrical SKIN: No acute rashes normal turgor, color, no bruising or petechiae. PSYCH: Oriented, good eye contact, no obvious depression anxiety, cognition and judgment appear normal. LN: no cervical axillary inguinal adenopathy  Lab Results  Component Value Date   WBC 5.4 09/26/2014   HGB 11.2* 09/26/2014   HCT 34.2* 09/26/2014   PLT 279.0 09/26/2014   GLUCOSE 95 09/26/2014   CHOL 166 12/14/2013   TRIG 95.0 12/14/2013   HDL 74.90 12/14/2013   LDLCALC 72 12/14/2013   ALT 17 12/14/2013   AST 25 12/14/2013   NA 139 09/26/2014   K 4.0 09/26/2014   CL 106 09/26/2014   CREATININE 0.67 09/26/2014   BUN 8 09/26/2014   CO2 29 09/26/2014   TSH 1.25 09/26/2014   Wt Readings from Last 3 Encounters:  04/25/15 162 lb 1.6 oz (73.528 kg)  11/24/14  164 lb 6.4 oz (74.571 kg)  11/06/14 164 lb 1.6 oz (74.435 kg)   BP Readings from Last 3 Encounters:  04/25/15 120/74  11/24/14 116/80  11/06/14 116/72     ASSESSMENT AND PLAN:  Discussed the following assessment and plan:  Visit for preventive health examination - Plan: Basic metabolic panel, CBC with Differential/Platelet, Hepatic function panel, Lipid panel, TSH  Need for prophylactic vaccination and inoculation against influenza - Plan: Flu Vaccine QUAD 36+ mos PF IM (Fluarix [employer]  PF)  Medication management - apporpaite use of med as needed maintaining  bmi 26   Foot pain, right  Insomnia - multifactorial   habit some  reviewed sleep hygeine ls to help .   Patient Care Team: Burnis Medin, MD as PCP - General Rozetta Nunnery, MD as Attending Physician (Otolaryngology) Patient Instructions  Will notify you  of labs when available. Continue lifestyle intervention healthy eating and exercise . See sports medicine Dr Charlann Boxer in out division at [facility] about the foot pain that is limiting your exercise program. Attend to sleep hygiene.  Get daylight in am  Very dark when sleep and avoid back lighting before bed Can try melatonin 2-4 hours pre bed time. Ok to continue phentermine as needed.    Health Maintenance, Female Adopting a healthy lifestyle and getting preventive care can go a long way to promote health and wellness. Talk with your health care provider about what schedule of regular examinations is right for you. This is a good chance for you to check in with your provider about disease prevention and staying healthy. In between checkups, there are plenty of things you can do on your own. Experts have done a lot of research about which lifestyle changes and preventive measures are most likely to keep you healthy. Ask your health care provider for more information. WEIGHT AND DIET  Eat a healthy diet  Be sure to include plenty of vegetables,  fruits, low-fat dairy products, and lean protein.  Do not eat a lot of foods high in solid fats, added sugars, or salt.  Get regular exercise. This is one of the most important things you can do for your health.  Most adults should exercise for at least 150 minutes each week. The exercise should increase your heart rate and make you sweat (moderate-intensity exercise).  Most adults should also do strengthening exercises at least twice a week. This is in addition to the moderate-intensity exercise.  Maintain a healthy weight  Body mass index (BMI) is a measurement that can be used to identify possible weight problems. It estimates body fat based on height and weight. Your health care provider can help determine your BMI and help you achieve or maintain a healthy weight.  For females 66 years of age and older:   A BMI below 18.5 is considered underweight.  A BMI of 18.5 to 24.9 is normal.  A BMI of 25 to 29.9 is considered overweight.  A BMI of 30 and above is considered obese.  Watch levels of cholesterol and blood lipids  You should start having your blood tested for lipids and cholesterol at 44 years of age, then have this test every 5 years.  You may need to have your cholesterol levels checked more often if:  Your lipid or cholesterol levels are high.  You are older than 44 years of age.  You are at high risk for heart disease.  CANCER SCREENING   Lung Cancer  Lung cancer screening is recommended for adults 68-16 years old who are at high risk for lung cancer because of a history of smoking.  A yearly low-dose CT scan of the lungs is recommended for people who:  Currently smoke.  Have quit within the past 15 years.  Have at least a 30-pack-year history of smoking. A pack year is smoking an average of one pack of cigarettes a day for 1 year.  Yearly screening should continue until it has been 15 years since  you quit.  Yearly screening should stop if you develop  a health problem that would prevent you from having lung cancer treatment.  Breast Cancer  Practice breast self-awareness. This means understanding how your breasts normally appear and feel.  It also means doing regular breast self-exams. Let your health care provider know about any changes, no matter how small.  If you are in your 20s or 30s, you should have a clinical breast exam (CBE) by a health care provider every 1-3 years as part of a regular health exam.  If you are 62 or older, have a CBE every year. Also consider having a breast X-ray (mammogram) every year.  If you have a family history of breast cancer, talk to your health care provider about genetic screening.  If you are at high risk for breast cancer, talk to your health care provider about having an MRI and a mammogram every year.  Breast cancer gene (BRCA) assessment is recommended for women who have family members with BRCA-related cancers. BRCA-related cancers include:  Breast.  Ovarian.  Tubal.  Peritoneal cancers.  Results of the assessment will determine the need for genetic counseling and BRCA1 and BRCA2 testing. Cervical Cancer Your health care provider may recommend that you be screened regularly for cancer of the pelvic organs (ovaries, uterus, and vagina). This screening involves a pelvic examination, including checking for microscopic changes to the surface of your cervix (Pap test). You may be encouraged to have this screening done every 3 years, beginning at age 49.  For women ages 79-65, health care providers may recommend pelvic exams and Pap testing every 3 years, or they may recommend the Pap and pelvic exam, combined with testing for human papilloma virus (HPV), every 5 years. Some types of HPV increase your risk of cervical cancer. Testing for HPV may also be done on women of any age with unclear Pap test results.  Other health care providers may not recommend any screening for nonpregnant women who  are considered low risk for pelvic cancer and who do not have symptoms. Ask your health care provider if a screening pelvic exam is right for you.  If you have had past treatment for cervical cancer or a condition that could lead to cancer, you need Pap tests and screening for cancer for at least 20 years after your treatment. If Pap tests have been discontinued, your risk factors (such as having a new sexual partner) need to be reassessed to determine if screening should resume. Some women have medical problems that increase the chance of getting cervical cancer. In these cases, your health care provider may recommend more frequent screening and Pap tests. Colorectal Cancer  This type of cancer can be detected and often prevented.  Routine colorectal cancer screening usually begins at 44 years of age and continues through 44 years of age.  Your health care provider may recommend screening at an earlier age if you have risk factors for colon cancer.  Your health care provider may also recommend using home test kits to check for hidden blood in the stool.  A small camera at the end of a tube can be used to examine your colon directly (sigmoidoscopy or colonoscopy). This is done to check for the earliest forms of colorectal cancer.  Routine screening usually begins at age 42.  Direct examination of the colon should be repeated every 5-10 years through 44 years of age. However, you may need to be screened more often if early forms of  precancerous polyps or small growths are found. Skin Cancer  Check your skin from head to toe regularly.  Tell your health care provider about any new moles or changes in moles, especially if there is a change in a mole's shape or color.  Also tell your health care provider if you have a mole that is larger than the size of a pencil eraser.  Always use sunscreen. Apply sunscreen liberally and repeatedly throughout the day.  Protect yourself by wearing long  sleeves, pants, a wide-brimmed hat, and sunglasses whenever you are outside. HEART DISEASE, DIABETES, AND HIGH BLOOD PRESSURE   High blood pressure causes heart disease and increases the risk of stroke. High blood pressure is more likely to develop in:  People who have blood pressure in the high end of the normal range (130-139/85-89 mm Hg).  People who are overweight or obese.  People who are African American.  If you are 1-82 years of age, have your blood pressure checked every 3-5 years. If you are 19 years of age or older, have your blood pressure checked every year. You should have your blood pressure measured twice--once when you are at a hospital or clinic, and once when you are not at a hospital or clinic. Record the average of the two measurements. To check your blood pressure when you are not at a hospital or clinic, you can use:  An automated blood pressure machine at a pharmacy.  A home blood pressure monitor.  If you are between 7 years and 60 years old, ask your health care provider if you should take aspirin to prevent strokes.  Have regular diabetes screenings. This involves taking a blood sample to check your fasting blood sugar level.  If you are at a normal weight and have a low risk for diabetes, have this test once every three years after 44 years of age.  If you are overweight and have a high risk for diabetes, consider being tested at a younger age or more often. PREVENTING INFECTION  Hepatitis B  If you have a higher risk for hepatitis B, you should be screened for this virus. You are considered at high risk for hepatitis B if:  You were born in a country where hepatitis B is common. Ask your health care provider which countries are considered high risk.  Your parents were born in a high-risk country, and you have not been immunized against hepatitis B (hepatitis B vaccine).  You have HIV or AIDS.  You use needles to inject street drugs.  You live with  someone who has hepatitis B.  You have had sex with someone who has hepatitis B.  You get hemodialysis treatment.  You take certain medicines for conditions, including cancer, organ transplantation, and autoimmune conditions. Hepatitis C  Blood testing is recommended for:  Everyone born from [birth year] through 1965.  Anyone with known risk factors for hepatitis C. Sexually transmitted infections (STIs)  You should be screened for sexually transmitted infections (STIs) including gonorrhea and chlamydia if:  You are sexually active and are younger than 44 years of age.  You are older than 44 years of age and your health care provider tells you that you are at risk for this type of infection.  Your sexual activity has changed since you were last screened and you are at an increased risk for chlamydia or gonorrhea. Ask your health care provider if you are at risk.  If you do not have HIV, but are at risk,  it may be recommended that you take a prescription medicine daily to prevent HIV infection. This is called pre-exposure prophylaxis (PrEP). You are considered at risk if:  You are sexually active and do not regularly use condoms or know the HIV status of your partner(s).  You take drugs by injection.  You are sexually active with a partner who has HIV. Talk with your health care provider about whether you are at high risk of being infected with HIV. If you choose to begin PrEP, you should first be tested for HIV. You should then be tested every 3 months for as long as you are taking PrEP.  PREGNANCY   If you are premenopausal and you may become pregnant, ask your health care provider about preconception counseling.  If you may become pregnant, take 400 to 800 micrograms (mcg) of folic acid every day.  If you want to prevent pregnancy, talk to your health care provider about birth control (contraception). OSTEOPOROSIS AND MENOPAUSE   Osteoporosis is a disease in which the bones lose  minerals and strength with aging. This can result in serious bone fractures. Your risk for osteoporosis can be identified using a bone density scan.  If you are 6 years of age or older, or if you are at risk for osteoporosis and fractures, ask your health care provider if you should be screened.  Ask your health care provider whether you should take a calcium or vitamin D supplement to lower your risk for osteoporosis.  Menopause may have certain physical symptoms and risks.  Hormone replacement therapy may reduce some of these symptoms and risks. Talk to your health care provider about whether hormone replacement therapy is right for you.  HOME CARE INSTRUCTIONS   Schedule regular health, dental, and eye exams.  Stay current with your immunizations.   Do not use any tobacco products including cigarettes, chewing tobacco, or electronic cigarettes.  If you are pregnant, do not drink alcohol.  If you are breastfeeding, limit how much and how often you drink alcohol.  Limit alcohol intake to no more than 1 drink per day for nonpregnant women. One drink equals 12 ounces of beer, 5 ounces of wine, or 1 ounces of hard liquor.  Do not use street drugs.  Do not share needles.  Ask your health care provider for help if you need support or information about quitting drugs.  Tell your health care provider if you often feel depressed.  Tell your health care provider if you have ever been abused or do not feel safe at home.   This information is not intended to replace advice given to you by your health care provider. Make sure you discuss any questions you have with your health care provider.   Document Released: 12/02/2010 Document Revised: 06/09/2014 Document Reviewed: 04/20/2013 Elsevier Interactive Patient Education 2016 Elsevier Inc.   Insomnia Insomnia is a sleep disorder that makes it difficult to fall asleep or to stay asleep. Insomnia can cause tiredness (fatigue), low  energy, difficulty concentrating, mood swings, and poor performance at work or school.  There are three different ways to classify insomnia:  Difficulty falling asleep.  Difficulty staying asleep.  Waking up too early in the morning. Any type of insomnia can be long-term (chronic) or short-term (acute). Both are common. Short-term insomnia usually lasts for three months or less. Chronic insomnia occurs at least three times a week for longer than three months. CAUSES  Insomnia may be caused by another condition, situation, or substance,  such as:  Anxiety.  Certain medicines.  Gastroesophageal reflux disease (GERD) or other gastrointestinal conditions.  Asthma or other breathing conditions.  Restless legs syndrome, sleep apnea, or other sleep disorders.  Chronic pain.  Menopause. This may include hot flashes.  Stroke.  Abuse of alcohol, tobacco, or illegal drugs.  Depression.  Caffeine.   Neurological disorders, such as Alzheimer disease.  An overactive thyroid (hyperthyroidism). The cause of insomnia may not be known. RISK FACTORS Risk factors for insomnia include:  Gender. Women are more commonly affected than men.  Age. Insomnia is more common as you get older.  Stress. This may involve your professional or personal life.  Income. Insomnia is more common in people with lower income.  Lack of exercise.   Irregular work schedule or night shifts.  Traveling between different time zones. SIGNS AND SYMPTOMS If you have insomnia, trouble falling asleep or trouble staying asleep is the main symptom. This may lead to other symptoms, such as:  Feeling fatigued.  Feeling nervous about going to sleep.  Not feeling rested in the morning.  Having trouble concentrating.  Feeling irritable, anxious, or depressed. TREATMENT  Treatment for insomnia depends on the cause. If your insomnia is caused by an underlying condition, treatment will focus on addressing the  condition. Treatment may also include:   Medicines to help you sleep.  Counseling or therapy.  Lifestyle adjustments. HOME CARE INSTRUCTIONS   Take medicines only as directed by your health care provider.  Keep regular sleeping and waking hours. Avoid naps.  Keep a sleep diary to help you and your health care provider figure out what could be causing your insomnia. Include:   When you sleep.  When you wake up during the night.  How well you sleep.   How rested you feel the next day.  Any side effects of medicines you are taking.  What you eat and drink.   Make your bedroom a comfortable place where it is easy to fall asleep:  Put up shades or special blackout curtains to block light from outside.  Use a white noise machine to block noise.  Keep the temperature cool.   Exercise regularly as directed by your health care provider. Avoid exercising right before bedtime.  Use relaxation techniques to manage stress. Ask your health care provider to suggest some techniques that may work well for you. These may include:  Breathing exercises.  Routines to release muscle tension.  Visualizing peaceful scenes.  Cut back on alcohol, caffeinated beverages, and cigarettes, especially close to bedtime. These can disrupt your sleep.  Do not overeat or eat spicy foods right before bedtime. This can lead to digestive discomfort that can make it hard for you to sleep.  Limit screen use before bedtime. This includes:  Watching TV.  Using your smartphone, tablet, and computer.  Stick to a routine. This can help you fall asleep faster. Try to do a quiet activity, brush your teeth, and go to bed at the same time each night.  Get out of bed if you are still awake after 15 minutes of trying to sleep. Keep the lights down, but try reading or doing a quiet activity. When you feel sleepy, go back to bed.  Make sure that you drive carefully. Avoid driving if you feel very  sleepy.  Keep all follow-up appointments as directed by your health care provider. This is important. SEEK MEDICAL CARE IF:   You are tired throughout the day or have trouble in your  daily routine due to sleepiness.  You continue to have sleep problems or your sleep problems get worse. SEEK IMMEDIATE MEDICAL CARE IF:   You have serious thoughts about hurting yourself or someone else.   This information is not intended to replace advice given to you by your health care provider. Make sure you discuss any questions you have with your health care provider.   Document Released: 05/16/2000 Document Revised: 02/07/2015 Document Reviewed: 02/17/2014 Elsevier Interactive Patient Education 2016 Kiln K. Anye Brose M.D.

## 2015-04-25 NOTE — Patient Instructions (Addendum)
Will notify you  of labs when available. Continue lifestyle intervention healthy eating and exercise . See sports medicine Dr Charlann Boxer in out division at [facility] about the foot pain that is limiting your exercise program. Attend to sleep hygiene.  Get daylight in am  Very dark when sleep and avoid back lighting before bed Can try melatonin 2-4 hours pre bed time. Ok to continue phentermine as needed.    Health Maintenance, Female Adopting a healthy lifestyle and getting preventive care can go a long way to promote health and wellness. Talk with your health care provider about what schedule of regular examinations is right for you. This is a good chance for you to check in with your provider about disease prevention and staying healthy. In between checkups, there are plenty of things you can do on your own. Experts have done a lot of research about which lifestyle changes and preventive measures are most likely to keep you healthy. Ask your health care provider for more information. WEIGHT AND DIET  Eat a healthy diet  Be sure to include plenty of vegetables, fruits, low-fat dairy products, and lean protein.  Do not eat a lot of foods high in solid fats, added sugars, or salt.  Get regular exercise. This is one of the most important things you can do for your health.  Most adults should exercise for at least 150 minutes each week. The exercise should increase your heart rate and make you sweat (moderate-intensity exercise).  Most adults should also do strengthening exercises at least twice a week. This is in addition to the moderate-intensity exercise.  Maintain a healthy weight  Body mass index (BMI) is a measurement that can be used to identify possible weight problems. It estimates body fat based on height and weight. Your health care provider can help determine your BMI and help you achieve or maintain a healthy weight.  For females 76 years of age and older:   A BMI below  18.5 is considered underweight.  A BMI of 18.5 to 24.9 is normal.  A BMI of 25 to 29.9 is considered overweight.  A BMI of 30 and above is considered obese.  Watch levels of cholesterol and blood lipids  You should start having your blood tested for lipids and cholesterol at 44 years of age, then have this test every 5 years.  You may need to have your cholesterol levels checked more often if:  Your lipid or cholesterol levels are high.  You are older than 44 years of age.  You are at high risk for heart disease.  CANCER SCREENING   Lung Cancer  Lung cancer screening is recommended for adults 67-46 years old who are at high risk for lung cancer because of a history of smoking.  A yearly low-dose CT scan of the lungs is recommended for people who:  Currently smoke.  Have quit within the past 15 years.  Have at least a 30-pack-year history of smoking. A pack year is smoking an average of one pack of cigarettes a day for 1 year.  Yearly screening should continue until it has been 15 years since you quit.  Yearly screening should stop if you develop a health problem that would prevent you from having lung cancer treatment.  Breast Cancer  Practice breast self-awareness. This means understanding how your breasts normally appear and feel.  It also means doing regular breast self-exams. Let your health care provider know about any changes, no matter how small.  If you are in your 20s or 30s, you should have a clinical breast exam (CBE) by a health care provider every 1-3 years as part of a regular health exam.  If you are 30 or older, have a CBE every year. Also consider having a breast X-ray (mammogram) every year.  If you have a family history of breast cancer, talk to your health care provider about genetic screening.  If you are at high risk for breast cancer, talk to your health care provider about having an MRI and a mammogram every year.  Breast cancer gene (BRCA)  assessment is recommended for women who have family members with BRCA-related cancers. BRCA-related cancers include:  Breast.  Ovarian.  Tubal.  Peritoneal cancers.  Results of the assessment will determine the need for genetic counseling and BRCA1 and BRCA2 testing. Cervical Cancer Your health care provider may recommend that you be screened regularly for cancer of the pelvic organs (ovaries, uterus, and vagina). This screening involves a pelvic examination, including checking for microscopic changes to the surface of your cervix (Pap test). You may be encouraged to have this screening done every 3 years, beginning at age 16.  For women ages 31-65, health care providers may recommend pelvic exams and Pap testing every 3 years, or they may recommend the Pap and pelvic exam, combined with testing for human papilloma virus (HPV), every 5 years. Some types of HPV increase your risk of cervical cancer. Testing for HPV may also be done on women of any age with unclear Pap test results.  Other health care providers may not recommend any screening for nonpregnant women who are considered low risk for pelvic cancer and who do not have symptoms. Ask your health care provider if a screening pelvic exam is right for you.  If you have had past treatment for cervical cancer or a condition that could lead to cancer, you need Pap tests and screening for cancer for at least 20 years after your treatment. If Pap tests have been discontinued, your risk factors (such as having a new sexual partner) need to be reassessed to determine if screening should resume. Some women have medical problems that increase the chance of getting cervical cancer. In these cases, your health care provider may recommend more frequent screening and Pap tests. Colorectal Cancer  This type of cancer can be detected and often prevented.  Routine colorectal cancer screening usually begins at 44 years of age and continues through 44 years  of age.  Your health care provider may recommend screening at an earlier age if you have risk factors for colon cancer.  Your health care provider may also recommend using home test kits to check for hidden blood in the stool.  A small camera at the end of a tube can be used to examine your colon directly (sigmoidoscopy or colonoscopy). This is done to check for the earliest forms of colorectal cancer.  Routine screening usually begins at age 36.  Direct examination of the colon should be repeated every 5-10 years through 44 years of age. However, you may need to be screened more often if early forms of precancerous polyps or small growths are found. Skin Cancer  Check your skin from head to toe regularly.  Tell your health care provider about any new moles or changes in moles, especially if there is a change in a mole's shape or color.  Also tell your health care provider if you have a mole that is larger than the  size of a pencil eraser.  Always use sunscreen. Apply sunscreen liberally and repeatedly throughout the day.  Protect yourself by wearing long sleeves, pants, a wide-brimmed hat, and sunglasses whenever you are outside. HEART DISEASE, DIABETES, AND HIGH BLOOD PRESSURE   High blood pressure causes heart disease and increases the risk of stroke. High blood pressure is more likely to develop in:  People who have blood pressure in the high end of the normal range (130-139/85-89 mm Hg).  People who are overweight or obese.  People who are African American.  If you are 40-67 years of age, have your blood pressure checked every 3-5 years. If you are 36 years of age or older, have your blood pressure checked every year. You should have your blood pressure measured twice--once when you are at a hospital or clinic, and once when you are not at a hospital or clinic. Record the average of the two measurements. To check your blood pressure when you are not at a hospital or clinic, you  can use:  An automated blood pressure machine at a pharmacy.  A home blood pressure monitor.  If you are between 55 years and 14 years old, ask your health care provider if you should take aspirin to prevent strokes.  Have regular diabetes screenings. This involves taking a blood sample to check your fasting blood sugar level.  If you are at a normal weight and have a low risk for diabetes, have this test once every three years after 45 years of age.  If you are overweight and have a high risk for diabetes, consider being tested at a younger age or more often. PREVENTING INFECTION  Hepatitis B  If you have a higher risk for hepatitis B, you should be screened for this virus. You are considered at high risk for hepatitis B if:  You were born in a country where hepatitis B is common. Ask your health care provider which countries are considered high risk.  Your parents were born in a high-risk country, and you have not been immunized against hepatitis B (hepatitis B vaccine).  You have HIV or AIDS.  You use needles to inject street drugs.  You live with someone who has hepatitis B.  You have had sex with someone who has hepatitis B.  You get hemodialysis treatment.  You take certain medicines for conditions, including cancer, organ transplantation, and autoimmune conditions. Hepatitis C  Blood testing is recommended for:  Everyone born from [birth year] through 1965.  Anyone with known risk factors for hepatitis C. Sexually transmitted infections (STIs)  You should be screened for sexually transmitted infections (STIs) including gonorrhea and chlamydia if:  You are sexually active and are younger than 44 years of age.  You are older than 44 years of age and your health care provider tells you that you are at risk for this type of infection.  Your sexual activity has changed since you were last screened and you are at an increased risk for chlamydia or gonorrhea. Ask your health  care provider if you are at risk.  If you do not have HIV, but are at risk, it may be recommended that you take a prescription medicine daily to prevent HIV infection. This is called pre-exposure prophylaxis (PrEP). You are considered at risk if:  You are sexually active and do not regularly use condoms or know the HIV status of your partner(s).  You take drugs by injection.  You are sexually active with a partner who  has HIV. Talk with your health care provider about whether you are at high risk of being infected with HIV. If you choose to begin PrEP, you should first be tested for HIV. You should then be tested every 3 months for as long as you are taking PrEP.  PREGNANCY   If you are premenopausal and you may become pregnant, ask your health care provider about preconception counseling.  If you may become pregnant, take 400 to 800 micrograms (mcg) of folic acid every day.  If you want to prevent pregnancy, talk to your health care provider about birth control (contraception). OSTEOPOROSIS AND MENOPAUSE   Osteoporosis is a disease in which the bones lose minerals and strength with aging. This can result in serious bone fractures. Your risk for osteoporosis can be identified using a bone density scan.  If you are 58 years of age or older, or if you are at risk for osteoporosis and fractures, ask your health care provider if you should be screened.  Ask your health care provider whether you should take a calcium or vitamin D supplement to lower your risk for osteoporosis.  Menopause may have certain physical symptoms and risks.  Hormone replacement therapy may reduce some of these symptoms and risks. Talk to your health care provider about whether hormone replacement therapy is right for you.  HOME CARE INSTRUCTIONS   Schedule regular health, dental, and eye exams.  Stay current with your immunizations.   Do not use any tobacco products including cigarettes, chewing tobacco, or  electronic cigarettes.  If you are pregnant, do not drink alcohol.  If you are breastfeeding, limit how much and how often you drink alcohol.  Limit alcohol intake to no more than 1 drink per day for nonpregnant women. One drink equals 12 ounces of beer, 5 ounces of wine, or 1 ounces of hard liquor.  Do not use street drugs.  Do not share needles.  Ask your health care provider for help if you need support or information about quitting drugs.  Tell your health care provider if you often feel depressed.  Tell your health care provider if you have ever been abused or do not feel safe at home.   This information is not intended to replace advice given to you by your health care provider. Make sure you discuss any questions you have with your health care provider.   Document Released: 12/02/2010 Document Revised: 06/09/2014 Document Reviewed: 04/20/2013 Elsevier Interactive Patient Education 2016 Elsevier Inc.   Insomnia Insomnia is a sleep disorder that makes it difficult to fall asleep or to stay asleep. Insomnia can cause tiredness (fatigue), low energy, difficulty concentrating, mood swings, and poor performance at work or school.  There are three different ways to classify insomnia:  Difficulty falling asleep.  Difficulty staying asleep.  Waking up too early in the morning. Any type of insomnia can be long-term (chronic) or short-term (acute). Both are common. Short-term insomnia usually lasts for three months or less. Chronic insomnia occurs at least three times a week for longer than three months. CAUSES  Insomnia may be caused by another condition, situation, or substance, such as:  Anxiety.  Certain medicines.  Gastroesophageal reflux disease (GERD) or other gastrointestinal conditions.  Asthma or other breathing conditions.  Restless legs syndrome, sleep apnea, or other sleep disorders.  Chronic pain.  Menopause. This may include hot  flashes.  Stroke.  Abuse of alcohol, tobacco, or illegal drugs.  Depression.  Caffeine.   Neurological disorders, such  as Alzheimer disease.  An overactive thyroid (hyperthyroidism). The cause of insomnia may not be known. RISK FACTORS Risk factors for insomnia include:  Gender. Women are more commonly affected than men.  Age. Insomnia is more common as you get older.  Stress. This may involve your professional or personal life.  Income. Insomnia is more common in people with lower income.  Lack of exercise.   Irregular work schedule or night shifts.  Traveling between different time zones. SIGNS AND SYMPTOMS If you have insomnia, trouble falling asleep or trouble staying asleep is the main symptom. This may lead to other symptoms, such as:  Feeling fatigued.  Feeling nervous about going to sleep.  Not feeling rested in the morning.  Having trouble concentrating.  Feeling irritable, anxious, or depressed. TREATMENT  Treatment for insomnia depends on the cause. If your insomnia is caused by an underlying condition, treatment will focus on addressing the condition. Treatment may also include:   Medicines to help you sleep.  Counseling or therapy.  Lifestyle adjustments. HOME CARE INSTRUCTIONS   Take medicines only as directed by your health care provider.  Keep regular sleeping and waking hours. Avoid naps.  Keep a sleep diary to help you and your health care provider figure out what could be causing your insomnia. Include:   When you sleep.  When you wake up during the night.  How well you sleep.   How rested you feel the next day.  Any side effects of medicines you are taking.  What you eat and drink.   Make your bedroom a comfortable place where it is easy to fall asleep:  Put up shades or special blackout curtains to block light from outside.  Use a white noise machine to block noise.  Keep the temperature cool.   Exercise  regularly as directed by your health care provider. Avoid exercising right before bedtime.  Use relaxation techniques to manage stress. Ask your health care provider to suggest some techniques that may work well for you. These may include:  Breathing exercises.  Routines to release muscle tension.  Visualizing peaceful scenes.  Cut back on alcohol, caffeinated beverages, and cigarettes, especially close to bedtime. These can disrupt your sleep.  Do not overeat or eat spicy foods right before bedtime. This can lead to digestive discomfort that can make it hard for you to sleep.  Limit screen use before bedtime. This includes:  Watching TV.  Using your smartphone, tablet, and computer.  Stick to a routine. This can help you fall asleep faster. Try to do a quiet activity, brush your teeth, and go to bed at the same time each night.  Get out of bed if you are still awake after 15 minutes of trying to sleep. Keep the lights down, but try reading or doing a quiet activity. When you feel sleepy, go back to bed.  Make sure that you drive carefully. Avoid driving if you feel very sleepy.  Keep all follow-up appointments as directed by your health care provider. This is important. SEEK MEDICAL CARE IF:   You are tired throughout the day or have trouble in your daily routine due to sleepiness.  You continue to have sleep problems or your sleep problems get worse. SEEK IMMEDIATE MEDICAL CARE IF:   You have serious thoughts about hurting yourself or someone else.   This information is not intended to replace advice given to you by your health care provider. Make sure you discuss any questions you have with  your health care provider.   Document Released: 05/16/2000 Document Revised: 02/07/2015 Document Reviewed: 02/17/2014 Elsevier Interactive Patient Education [employer].

## 2015-06-07 ENCOUNTER — Telehealth: Payer: Self-pay | Admitting: Internal Medicine

## 2015-06-07 NOTE — Telephone Encounter (Signed)
lvm for pt to call back and sched appt

## 2015-06-07 NOTE — Telephone Encounter (Signed)
Pt was wondering if an antibiotic could be called in for her ear infection. Pt uses the  [facility] on [address]. Please advise.

## 2015-06-07 NOTE — Telephone Encounter (Signed)
Pt will need to come in and be seen to be evaluated for antibiotic.  Please help her to make an appt.

## 2015-08-20 ENCOUNTER — Telehealth: Payer: Self-pay | Admitting: Internal Medicine

## 2015-08-20 NOTE — Telephone Encounter (Signed)
lmom for pt to call back

## 2015-08-20 NOTE — Telephone Encounter (Signed)
Pt has a lump under neck and would like late appointment. Can I use sda slot?

## 2015-08-20 NOTE — Telephone Encounter (Signed)
Yes, please accommodate pt.  Thanks!

## 2015-08-21 NOTE — Telephone Encounter (Signed)
Pt has been sch for 08-27-15

## 2015-08-21 NOTE — Telephone Encounter (Signed)
lmom for pt to call back

## 2015-08-26 NOTE — Progress Notes (Signed)
Chief Complaint  Patient presents with  . lump on chest    noticed it at least a month ago    HPI: Sandra Key 45 y.o.  Patient Sandra Key  comes in today for SDA for  new problem evaluation. notea bout month ago without trauma asymmetry non tender lump anterior chest  fam hx of lymphoma concern Also some wheeze ? w exercise  Has allergy  More sob than others  No cough  No cp  Can she have refill for phentermine when  Needs it doesn't use every day  ROS: See pertinent positives and negatives per HPI.  Past Medical History  Diagnosis Date  . Allergic rhinitis     History of allergy testing positive  . Asthma   . Recurrent pregnancy loss   . Tinnitus, right     Had mri  and eustachian tube dysfunction    Family History  Problem Relation Age of Onset  . Autism spectrum disorder Child     Social History   Social History  . Marital Status: Married    Spouse Name: N/A  . Number of Children: N/A  . Years of Education: N/A   Social History Main Topics  . Smoking status: Never Smoker   . Smokeless tobacco: Never Used  . Alcohol Use: 0.0 oz/week    0 Standard drinks or equivalent per week     Comment: occ.  . Drug Use: No  . Sexual Activity: Not Asked   Other Topics Concern  . None   Social History Narrative   Separated husband in [place]  Coming back so far ,    Son with autism  One at [employer] one at  [employer].         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    Husband not yet working.  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 cetirizine (ZYRTEC) 10 MG tablet Take  10 mg by mouth daily.    . naproxen (NAPROSYN) 500 MG tablet Take 1 tablet (500 mg total) by mouth 2 (two) times daily with a meal. 40 tablet 0  . phentermine 30 MG capsule TAKE ONE CAPSULE IN THE MORNING 30 capsule 0  . valACYclovir (VALTREX) 1000 MG tablet Take 1 tablet (1,000 mg total) by mouth 2 (two) times daily. 60 tablet 0   No facility-administered medications prior to visit.     EXAM:  BP 102/64 mmHg  Pulse 62  Temp(Src) 98.5 F (36.9 C) (Oral)  Wt 161 lb (73.029 kg)  LMP 08/22/2015  Body mass index is 26.37 kg/(m^2).  GENERAL: vitals reviewed and listed above, alert, oriented, appears well hydrated and in no acute distress HEENT: atraumatic, conjunctiva  clear, no obvious abnormalities on inspection of external nose and ears OP : no lesion edema or exudate  NECK: no obvious masses on inspection palpation  Left scm area fuller than right non  tender  No adenopathy  Back no scoliosis  LUNGS: clear to auscultation bilaterally, no wheezes, rales or rhonchi, good air movement CV: HRRR, no clubbing cyanosis or  peripheral edema nl cap refill  MS: moves all extremities without noticeable focal  abnormality PSYCH: pleasant and cooperative, no obvious depression or anxiety Lab Results  Component Value Date   WBC 9.0 04/25/2015   HGB 11.8* 04/25/2015   HCT 36.6 04/25/2015   PLT 291.0 04/25/2015   GLUCOSE 77 04/25/2015   CHOL 168 04/25/2015   TRIG 108.0 04/25/2015   HDL 66.10 04/25/2015   LDLCALC 80 04/25/2015   ALT 12 04/25/2015   AST 16 04/25/2015   NA 140 04/25/2015   K 4.1 04/25/2015   CL 105 04/25/2015   CREATININE 0.70 04/25/2015   BUN 9 04/25/2015   CO2 29 04/25/2015   TSH 1.12 04/25/2015   Wt Readings from Last 3 Encounters:  08/27/15 161 lb (73.029 kg)  04/25/15 162 lb 1.6 oz (73.528 kg)  11/24/14 164 lb 6.4 oz (74.571 kg)   BP Readings from Last 3 Encounters:  08/27/15 102/64  04/25/15 120/74  11/24/14 116/80     ASSESSMENT AND PLAN:  Discussed  the following assessment and plan:  Chest wall deformity scst joint left - x ray prob inseg but if progressind can get more imagins - Plan: DG Chest 2 View, DG Clavicle Left  Dyspnea on exertion - poss exercise induced bronchospasm  - Plan: DG Chest 2 View  Medication management - ok to refill nex phenterine  when needed ? Wheeze after working out.  consider   eia  -Patient advised to return or notify health care team  if symptoms worsen ,persist or new concerns arise.  Patient Instructions  Try  Albuterol inhaler   2 puffs  15-30 pre exercise  This could be an asthmatic sx . If  conitnuing or worrisome make fu appt.  The area of asymmtery   Is at the clavicle   Sternal and rib joint  This is not a lymph gland   .  If  X ray ok the observe if changing then consider getting ct or the area .  Ok to call for next phentermine rx .    Neta Mends. Panosh M.D.

## 2015-08-27 ENCOUNTER — Ambulatory Visit (INDEPENDENT_AMBULATORY_CARE_PROVIDER_SITE_OTHER)
Admission: RE | Admit: 2015-08-27 | Discharge: 2015-08-27 | Disposition: A | Discharge status: Home / Self Care | Payer: BC Managed Care – PPO | Source: Ambulatory Visit | Attending: Internal Medicine | Admitting: Internal Medicine

## 2015-08-27 ENCOUNTER — Encounter: Payer: Self-pay | Admitting: Internal Medicine

## 2015-08-27 ENCOUNTER — Ambulatory Visit (INDEPENDENT_AMBULATORY_CARE_PROVIDER_SITE_OTHER): Payer: BC Managed Care – PPO | Admitting: Internal Medicine

## 2015-08-27 ENCOUNTER — Encounter: Payer: Self-pay | Admitting: *Deleted

## 2015-08-27 VITALS — BP 102/64 | HR 62 | Temp 98.5°F | Wt 161.0 lb

## 2015-08-27 DIAGNOSIS — Z79899 Other long term (current) drug therapy: Secondary | ICD-10-CM

## 2015-08-27 DIAGNOSIS — M954 Acquired deformity of chest and rib: Secondary | ICD-10-CM

## 2015-08-27 DIAGNOSIS — R0609 Other forms of dyspnea: Secondary | ICD-10-CM

## 2015-08-27 NOTE — Progress Notes (Signed)
Pre visit review using our clinic review tool, if applicable. No additional management support is needed unless otherwise documented below in the visit note. 

## 2015-08-27 NOTE — Patient Instructions (Signed)
Try  Albuterol inhaler   2 puffs  15-30 pre exercise  This could be an asthmatic sx . If  conitnuing or worrisome make fu appt.  The area of asymmtery   Is at the clavicle   Sternal and rib joint  This is not a lymph gland   .  If  X ray ok the observe if changing then consider getting ct or the area .  Ok to call for next phentermine rx .

## 2015-09-01 ENCOUNTER — Encounter: Payer: Self-pay | Admitting: Internal Medicine

## 2015-09-10 ENCOUNTER — Other Ambulatory Visit: Payer: Self-pay | Admitting: Internal Medicine

## 2015-09-10 NOTE — Telephone Encounter (Signed)
Pt is  on phentermine and would like to try [facility]ve [facility] [address] Pt was last seen 08-27-15

## 2015-09-10 NOTE — Telephone Encounter (Signed)
contrave is a  Different medicine   Is a combo of  wellbutrin and naltrexone and is taken every day and increased slowly   Cost may be a  Lot  Look for coupons on line   Stop the phentermine   ROV 1 week after starting   naltrexone hydrochloride 8 mg/bupropion hydrochloride 90 mg (1 tablet) orally once daily in the morning for week 1; then 1 tablet twice daily, morning and evening, for week 2; then 2 tablets in the morning and 1 tablet in the evening for week 3 [1] then 2 po bid

## 2015-09-11 NOTE — Telephone Encounter (Signed)
Tried reaching the pt.  Voicemail box is full.  Will try again at a later time.

## 2015-09-11 NOTE — Telephone Encounter (Signed)
Spoke to the pt.  She is currently not at home.  I will call her tomorrow morning to help set up appt.  Will then send in new rx.

## 2015-09-12 MED ORDER — NALTREXONE-BUPROPION HCL ER 8-90 MG PO TB12: ORAL_TABLET | ORAL | Status: DC

## 2015-09-12 NOTE — Telephone Encounter (Signed)
Left a message for a return call.

## 2015-09-12 NOTE — Telephone Encounter (Signed)
Called to the pharmacy and left on machine. 

## 2015-09-13 ENCOUNTER — Telehealth: Payer: Self-pay | Admitting: Internal Medicine

## 2015-09-13 NOTE — Telephone Encounter (Signed)
Pt called stating it is not at the Pharmacy.  Please resend

## 2015-09-13 NOTE — Telephone Encounter (Signed)
Pt states her RX Naltrexone-Bupropion HCl ER (CONTRAVE) 8-90 MG TB12  is not at the pharmacy. Pt would like to know if you can resend?  [facility]

## 2015-09-17 ENCOUNTER — Telehealth: Payer: Self-pay | Admitting: Family Medicine

## 2015-09-17 NOTE — Telephone Encounter (Signed)
Pt walked into to the office today.  She complained of left shoulder pain, racing heart, lightheadedness, facial numbness and left hand numbness.  Sx started around 12:30/1 AM.  Discussed with WP.  She advised pt should be seen at ED.  Spoke to the pt and informed her of WP's advise.  Asked if I could call someone to come take her or if I could call an ambulance.  She declined stating she had to pick up her daughter.  See vitals below.  BP: 115/80 HR: 82 Temp: 98.0 O2: 98 Wt: 165.8lb

## 2015-09-17 NOTE — Telephone Encounter (Signed)
Spoke to the pharmacist. This prescription required prior authorization.  Pt notified.

## 2015-09-18 NOTE — Telephone Encounter (Signed)
Pt stated she would go to ED later on 09/17/15

## 2015-09-19 ENCOUNTER — Encounter: Payer: BC Managed Care – PPO | Admitting: Internal Medicine

## 2015-09-19 ENCOUNTER — Telehealth: Payer: Self-pay | Admitting: Family Medicine

## 2015-09-19 DIAGNOSIS — Z0289 Encounter for other administrative examinations: Secondary | ICD-10-CM

## 2015-09-19 NOTE — Telephone Encounter (Signed)
Pt missed her appt today with WP.  Left a message for her to return my call.

## 2015-09-19 NOTE — Progress Notes (Signed)
Document opened and reviewed for OV but appt  NS same day .   

## 2015-09-20 NOTE — Telephone Encounter (Signed)
PA submitted to insurance company on 09/19/15.  Pt should allow 3-5 business days for response back from insurance.

## 2015-09-21 NOTE — Telephone Encounter (Signed)
Please  Contact patient about this  Also  Did she get evaluated about the  Numbness and chest palpitations !  Please see if she is doing ok   And how we can help. reschedule appt etc .

## 2015-09-21 NOTE — Telephone Encounter (Signed)
Prior authorization for  Naltrexone-Bupropion HCl ER (CONTRAVE) 8-90 MG TB12 has been denied by [facility]  stating:  Current plan approved criteria does not allow coverage of Contrave(naltrexone-bupropion ER) if the patient does not have a body mass index (BMI) greater than or equal to 30 kg per square meter or a BMI greater than or equal to 27kg per square meter AND has additional risk factors.

## 2015-09-24 NOTE — Telephone Encounter (Signed)
Left a message for a return call.

## 2015-09-27 NOTE — Telephone Encounter (Signed)
Spoke to the pt.  She is feeling much better.  Stated that she thought she had pulled a muscle in her arm.  She remembered lifting something heavy a few days before.  I informed her that her insurance company will not pay for the Contrave.  She will call the pharmacy to find out how much the out of pocket expense will be.

## 2015-10-31 ENCOUNTER — Telehealth: Payer: Self-pay | Admitting: Internal Medicine

## 2015-10-31 NOTE — Telephone Encounter (Addendum)
Pt would like to know when and where she had her last mammogram pt does not remember where it was done at.

## 2015-11-01 NOTE — Telephone Encounter (Signed)
Notified pt. 

## 2015-11-05 ENCOUNTER — Other Ambulatory Visit: Payer: Self-pay | Admitting: Internal Medicine

## 2015-11-05 DIAGNOSIS — Z1231 Encounter for screening mammogram for malignant neoplasm of breast: Secondary | ICD-10-CM

## 2015-11-12 ENCOUNTER — Ambulatory Visit (INDEPENDENT_AMBULATORY_CARE_PROVIDER_SITE_OTHER): Payer: BC Managed Care – PPO | Admitting: Family Medicine

## 2015-11-12 VITALS — BP 110/80 | HR 70 | Temp 98.4°F | Ht 65.5 in | Wt 167.0 lb

## 2015-11-12 DIAGNOSIS — J069 Acute upper respiratory infection, unspecified: Secondary | ICD-10-CM | POA: Diagnosis not present

## 2015-11-12 DIAGNOSIS — L03311 Cellulitis of abdominal wall: Secondary | ICD-10-CM

## 2015-11-12 MED ORDER — DOXYCYCLINE HYCLATE 100 MG PO CAPS: 100.0000 mg | ORAL_CAPSULE | Freq: Two times a day (BID) | ORAL | Status: AC

## 2015-11-12 NOTE — Progress Notes (Signed)
   Subjective:    Patient ID: Sandra Key, female    DOB: 1970-08-22, 45 y.o.   MRN: [id number]  HPI Patient seen with 2 week history of respiratory symptoms Onset initially of sore throat followed by 2 week history of cough which is mostly dry. Denies sinusitis symptoms. No headache. No fevers or chills. She developed laryngitis symptoms about 3-4 days ago. Never smoked.  Patient also has erythematous area slightly tender to touch left lower abdomen. She first noticed this late last week. She initially thought this was some sort of bite. No hx of MRSA.  No injury.  Past Medical History  Diagnosis Date  . Allergic rhinitis     History of allergy testing positive  . Asthma   . Recurrent pregnancy loss   . Tinnitus, right     Had mri  and eustachian tube dysfunction   Past Surgical History  Procedure Laterality Date  . Cesarean section    . Breast reduction surgery    . Tympanostomy tube placement  2009    right      reports that she has never smoked. She has never used smokeless tobacco. She reports that she drinks alcohol. She reports that she does not use illicit drugs. family history includes Autism spectrum disorder in her child. Allergies  Allergen Reactions  . Latex Rash  . Codeine Phosphate     REACTION: unspecified  . Hydrocodone   . Peanut-Containing Drug Products   . Shellfish Allergy        Review of Systems  Constitutional: Negative for fever and chills.  HENT: Positive for voice change. Negative for sore throat.   Respiratory: Positive for cough.   Skin: Positive for rash.       Objective:   Physical Exam  Constitutional: She appears well-developed and well-nourished.  HENT:  Right Ear: External ear normal.  Left Ear: External ear normal.  Mouth/Throat: Oropharynx is clear and moist.  Scarring of right eardrum from previous tubes but no acute changes  Neck: Neck supple.  Cardiovascular: Normal rate and regular rhythm.     Pulmonary/Chest: Effort normal and breath sounds normal. No respiratory distress. She has no wheezes. She has no rales.  Lymphadenopathy:    She has no cervical adenopathy.  Skin:  Left lower abdominal wall approximately 2 cm area of erythema. Minimally tender to palpation. Slightly indurated. No fluctuance.          Assessment & Plan:  #1 upper respiratory infection with laryngitis. Suspect viral. Treat symptomatically  #2 small area of cellulitis left lower abdominal wall. Possible early abscess. No hx of recurrent staph.   Doxycycline 100 mg twice daily for 10 days. Warm compresses. Follow-up promptly for any increased redness, swelling or this is not resolving over the next few days  Kristian CoveyBruce W Toba Claudio MD [facility]

## 2015-11-12 NOTE — Progress Notes (Signed)
Pre visit review using our clinic review tool, if applicable. No additional management support is needed unless otherwise documented below in the visit note. 

## 2015-11-12 NOTE — Patient Instructions (Signed)
Follow up for any progressive redness, warmth, or swelling.

## 2015-11-16 ENCOUNTER — Ambulatory Visit
Admission: RE | Admit: 2015-11-16 | Discharge: 2015-11-16 | Disposition: A | Discharge status: Home / Self Care | Payer: BC Managed Care – PPO | Source: Ambulatory Visit | Attending: Internal Medicine | Admitting: Internal Medicine

## 2015-11-16 DIAGNOSIS — Z1231 Encounter for screening mammogram for malignant neoplasm of breast: Secondary | ICD-10-CM

## 2015-11-20 NOTE — Progress Notes (Signed)
Pre visit review using our clinic review tool, if applicable. No additional management support is needed unless otherwise documented below in the visit note.  Chief Complaint  Patient presents with  . Anxiety    Pt feeling anxious about move to [place] on 12/01/15.  Also having some abdominal pain.  . Abdominal Pain    HPI: Sandra Key 45 y.o. comes in today for a number of issues. She is moving to [place] in July because he to extended family find a job up there and get her kids in school. She may have a time with no insurance. She has a skin infection and was put on antibiotic getting better but worried and has questions about what to do if it recurs or what to look for. She feels that she is very stressed and overwhelmed at times anxious and may be a bit depressed but has never had this before. Wonders if medication would be helpful during this transition. She is not in counseling nor hasn't been area She was having a funny feeling around her umbilicus wonders if it's a hernia feels that it moves around. No vomiting. Still trying to maintain a lower weight. Hasn't been on phentermine too long.  States her mother does have some type of anxiety and worry and is on some type of medicine. ROS: See pertinent positives and negatives per HPI. No fever UTI bleeding. States that sometimes the left side of her abdomen is sore.  Past Medical History  Diagnosis Date  . Allergic rhinitis     History of allergy testing positive  . Asthma   . Recurrent pregnancy loss   . Tinnitus, right     Had mri  and eustachian tube dysfunction    Family History  Problem Relation Age of Onset  . Autism spectrum disorder Child     Social History   Social History  . Marital Status: Married    Spouse Name: N/A  . Number of Children: N/A  . Years of Education: N/A   Social History Main Topics  . Smoking status: Never Smoker   . Smokeless tobacco: Never Used  . Alcohol Use: 0.0 oz/week    0  Standard drinks or equivalent per week     Comment: occ.  . Drug Use: No  . Sexual Activity: Not Asked   Other Topics Concern  . None   Social History Narrative   Separated husband in [place]  Coming back so far ,    Son with autism  One at [employer] one at  [employer] 9th grade.         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    Husband not yet working.  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 cetirizine (ZYRTEC) 10 MG tablet Take 10 mg by mouth daily.    Marland Kitchen doxycycline (VIBRAMYCIN) 100 MG capsule Take 1 capsule (100 mg total) by mouth 2 (  two) times daily. 20 capsule 0  . phentermine 30 MG capsule TAKE ONE CAPSULE IN THE MORNING 30 capsule 0  . valACYclovir (VALTREX) 1000 MG tablet Take 1 tablet (1,000 mg total) by mouth 2 (two) times daily. 60 tablet 0  . Naltrexone-Bupropion HCl ER (CONTRAVE) 8-90 MG TB12 (1 tablet) orally once daily in the morning for week 1 7 tablet 0  . naproxen (NAPROSYN) 500 MG tablet Take 1 tablet (500 mg total) by mouth 2 (two) times daily with a meal. 40 tablet 0   No facility-administered medications prior to visit.     EXAM:  BP 116/80 mmHg  Temp(Src) 98.1 F (36.7 C) (Oral)  Wt 167 lb 8 oz (75.978 kg)  Body mass index is 27.44 kg/(m^2).  GENERAL: vitals reviewed and listed above, alert, oriented, appears well hydrated and in no acute distress emotional at times  HEENT: atraumatic, conjunctiva  clear, no obvious abnormalities on inspection of external nose and ears   NECK: no obvious masses on inspection palpation  Skin fading boil left abdomen no fluctuance or discharge  adenopathy or streaking. Abdomen without obvious masses at the umbilicus there is a less than finger breath possible very small umbilical hernia without pain or redness. PSYCH: pleasant and cooperative, some mild anxiety and emotional at times but normal cognition.   ASSESSMENT AND PLAN:  Discussed the following assessment and plan:  Adjustment disorder with mixed anxiety and depressed mood  Stress  Cellulitis of left abdominal wall - improved  Her tasks are very overextended and responsibilities are high. With multiple high risk transitions. Discussed importance unusual situation of counseling support consideration of medication. She asks about medicine that can help smooth it over. Would've caution about use of some medication but because she is on my chart we may be able to start a low dose SSRI and sparing use of lorazepam because she will not be able to physically come back for follow-up. Expectant management. The amount of responsibility she has right now in this transition her mounting. She is continuing to teach summer school. She states that her insurance will end August suggested she look in the exchange with a 60 day Delorise ShinerGrace. Or she gets another job. Think she is worried very much about all these transitions. Negative tobacco significant alcohol except social no RD. Some caffeine at times. Would not add phentermine or other medications at this time with this treatment plan. I do not think the skin infection or the abdominal wall symptoms exam are of clinical significance at this time. -Patient advised to return or notify health care team  if symptoms worsen ,persist or new concerns arise.  Expectant management.  meds etc  Risk benefit of medication discussed.  Total visit 40mins > 50% spent counseling and coordinating care as indicated in above note and in instructions to patient .     Patient Instructions  Anxiety depression   Counseling and support can help .  Begin med as  discussed .   Can add benzo  Med only   Short term for panic but these meds should be limited   Use for rebound .   Anxiety depression  May take  2-3 weeks to see response and then maximum response  In 4-5 weeks .  Need  Fu in 3 weeks  On line or elsewhere about medication.    Think the skin is healing and no internvetion needed at this time.       Neta MendsWanda K. Panosh M.D.

## 2015-11-21 ENCOUNTER — Ambulatory Visit (INDEPENDENT_AMBULATORY_CARE_PROVIDER_SITE_OTHER): Payer: BC Managed Care – PPO | Admitting: Internal Medicine

## 2015-11-21 ENCOUNTER — Encounter: Payer: Self-pay | Admitting: Internal Medicine

## 2015-11-21 VITALS — BP 116/80 | Temp 98.1°F | Wt 167.5 lb

## 2015-11-21 DIAGNOSIS — Z658 Other specified problems related to psychosocial circumstances: Secondary | ICD-10-CM | POA: Diagnosis not present

## 2015-11-21 DIAGNOSIS — F4323 Adjustment disorder with mixed anxiety and depressed mood: Secondary | ICD-10-CM

## 2015-11-21 DIAGNOSIS — F439 Reaction to severe stress, unspecified: Secondary | ICD-10-CM

## 2015-11-21 DIAGNOSIS — L03311 Cellulitis of abdominal wall: Secondary | ICD-10-CM | POA: Diagnosis not present

## 2015-11-21 MED ORDER — LORAZEPAM 1 MG PO TABS: 0.5000 mg | ORAL_TABLET | Freq: Two times a day (BID) | ORAL | Status: AC | PRN

## 2015-11-21 MED ORDER — CITALOPRAM HYDROBROMIDE 20 MG PO TABS: 10.0000 mg | ORAL_TABLET | Freq: Every day | ORAL | Status: DC

## 2015-11-21 NOTE — Patient Instructions (Addendum)
Anxiety depression   Counseling and support can help .  Begin med as discussed .   Can add benzo  Med only   Short term for panic but these meds should be limited   Use for rebound .   Anxiety depression  May take  2-3 weeks to see response and then maximum response  In 4-5 weeks .  Need  Fu in 3 weeks  On line or elsewhere about medication.    Think the skin is healing and no internvetion needed at this time.

## 2015-12-19 ENCOUNTER — Telehealth: Payer: Self-pay | Admitting: Internal Medicine

## 2015-12-19 NOTE — Telephone Encounter (Signed)
Pt would like misty to return her call concerning phentermine med

## 2015-12-19 NOTE — Telephone Encounter (Signed)
Left a message for a return call.

## 2015-12-19 NOTE — Telephone Encounter (Signed)
Pt returned your call and will be available up til 2:00pm after that she will have to give you a call back 12/20/15

## 2015-12-20 ENCOUNTER — Telehealth: Payer: Self-pay | Admitting: Internal Medicine

## 2015-12-20 NOTE — Telephone Encounter (Signed)
Spoke to the pt.  She has stopped taking the citalopram (CELEXA) 20 MG tablet due to sleepiness.  She would like a refill of phentermine.  Please advise.  [facility] [address] [place] [address]

## 2015-12-20 NOTE — Telephone Encounter (Signed)
Error/ltd ° °

## 2015-12-20 NOTE — Telephone Encounter (Signed)
Can refill phentermine x 1  If  Anxiety sx continue we may try a different med  Such as  lexapro etc

## 2015-12-21 ENCOUNTER — Other Ambulatory Visit: Payer: Self-pay | Admitting: Internal Medicine

## 2015-12-21 ENCOUNTER — Telehealth: Payer: Self-pay | Admitting: Internal Medicine

## 2015-12-21 MED ORDER — PHENTERMINE HCL 30 MG PO CAPS: ORAL_CAPSULE | ORAL | Status: DC

## 2015-12-21 NOTE — Addendum Note (Signed)
Addended by: Raj JanusADKINS, MISTY T on: 12/21/2015 09:36 AM   Modules accepted: Orders, Medications

## 2015-12-21 NOTE — Telephone Encounter (Signed)
[facility]

## 2015-12-21 NOTE — Telephone Encounter (Signed)
Rx re-faxed.

## 2015-12-21 NOTE — Telephone Encounter (Signed)
Called pharmacy and they have not received fax. Called Rx into pharmacy voicemail.

## 2015-12-21 NOTE — Telephone Encounter (Signed)
Pt notified that rx is being faxed to pharmacy.  Informed that if her anxiety continues than may try a different med such as lexapro.  Pt agrees.

## 2015-12-26 NOTE — Telephone Encounter (Signed)
Spoke to the pt.  We had the wrong address on file.  Pt changed the address today.  Afraid that the old prescription will be returned to [place].  Can we mail another to the correct address or wait to see if other will be returned?

## 2015-12-26 NOTE — Telephone Encounter (Signed)
We'll have to wait to see if we get the  rx returned to [place]   . Please document the old address that the prescription was sent to in the record. And the address that is new where the future prescription should be sent.

## 2015-12-26 NOTE — Telephone Encounter (Signed)
Pt following up to see if rx was mailed. Pt had the wrong address, but it is corrected.  phentermine 30 MG capsule

## 2015-12-27 NOTE — Telephone Encounter (Signed)
Pt said the address where the first prescription was mailed is [address] [place] [facility] [address]  The correct address is  [address]  [place] [facility] [place] [address]

## 2016-01-01 ENCOUNTER — Telehealth: Payer: Self-pay | Admitting: Internal Medicine

## 2016-01-01 NOTE — Telephone Encounter (Signed)
Pt would like misty to return he call concerning now lost rx

## 2016-01-02 NOTE — Telephone Encounter (Signed)
Cant do anything more   Except wait  Was the  Letter registered / if certified?

## 2016-01-02 NOTE — Telephone Encounter (Signed)
Spoke to the pt.  She has not received mailed refill of phentermine.  We have not received a return envelope. Please advise. Thanks!!!

## 2016-01-03 NOTE — Telephone Encounter (Signed)
Spoke to [place] and informed her that a new prescription cannot be authorized until the old prescription is returned to the office.  Pt understands and agrees.

## 2016-01-09 NOTE — Telephone Encounter (Signed)
Can sen refill x 1 phentermine to local pharmacy as requestsed.

## 2016-01-09 NOTE — Telephone Encounter (Signed)
Pt states she spoke with the mailman, and a lot of people live at the address we sent to. So they probably tossed it. Pt states if we could [facility] to local  [facility] [address], [place], [place] [address] Phone: [phone]  She can have her friend bring to her in [place].

## 2016-01-10 MED ORDER — PHENTERMINE HCL 30 MG PO CAPS: ORAL_CAPSULE | ORAL | 0 refills | Status: AC

## 2016-01-10 NOTE — Telephone Encounter (Signed)
Medication called to the pharmacy and left on machine.  Left a message for the pt to return my call.  She will need to be notified.

## 2016-01-10 NOTE — Addendum Note (Signed)
Addended by: Raj JanusADKINS, MISTY T on: 01/10/2016 11:56 AM   Modules accepted: Orders

## 2016-01-11 NOTE — Telephone Encounter (Signed)
Left a message for a return call.

## 2016-01-14 NOTE — Telephone Encounter (Signed)
Pt notified that rx has been called in

## 2017-02-20 ENCOUNTER — Encounter: Payer: Self-pay | Admitting: Internal Medicine

## 2018-01-26 IMAGING — DX DG CHEST 2V
2 series · 2 of 2 positions shown · non-contrast
Comparison: Chest radiograph July 13, 2012; chest CT July 14, 2012

CLINICAL DATA: Shortness of breath with chest pain for 2 weeks.
Cough and congestion

EXAM:
CHEST  2 VIEW

[chest pa]
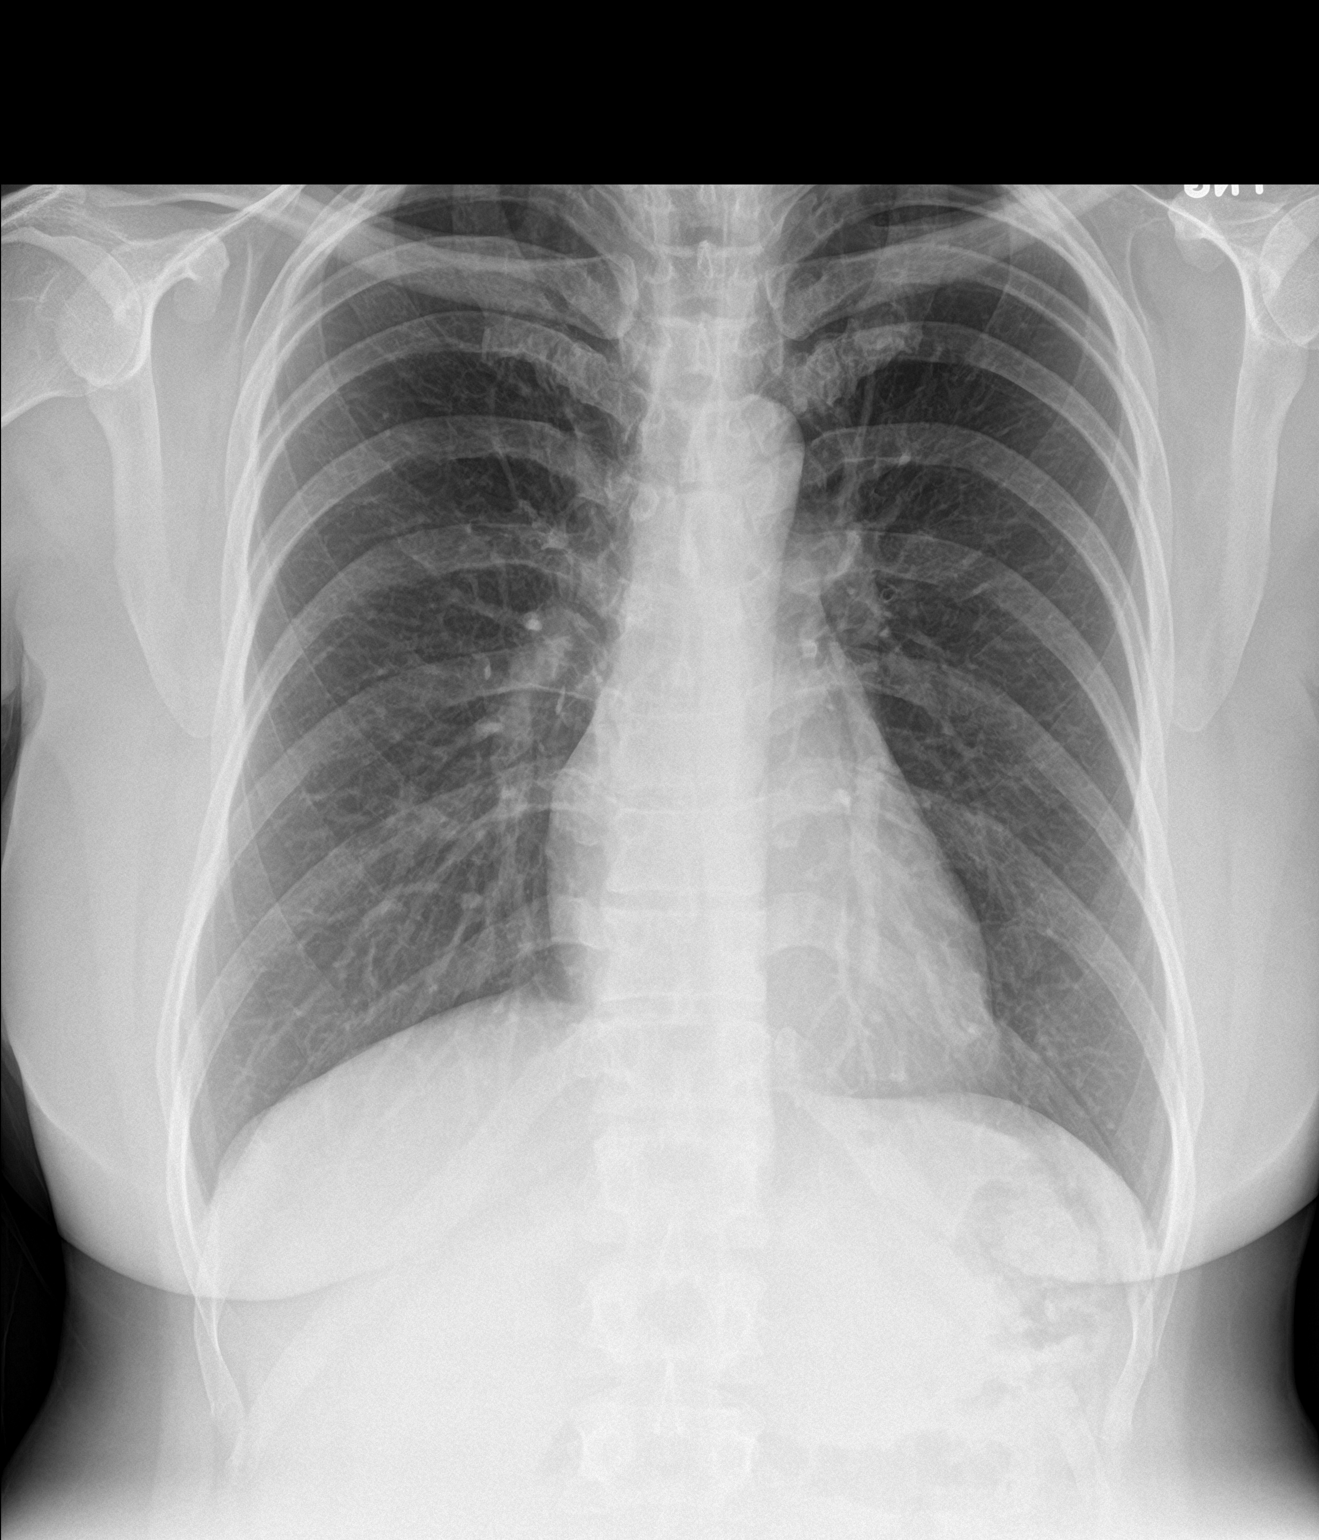

[chest lat]
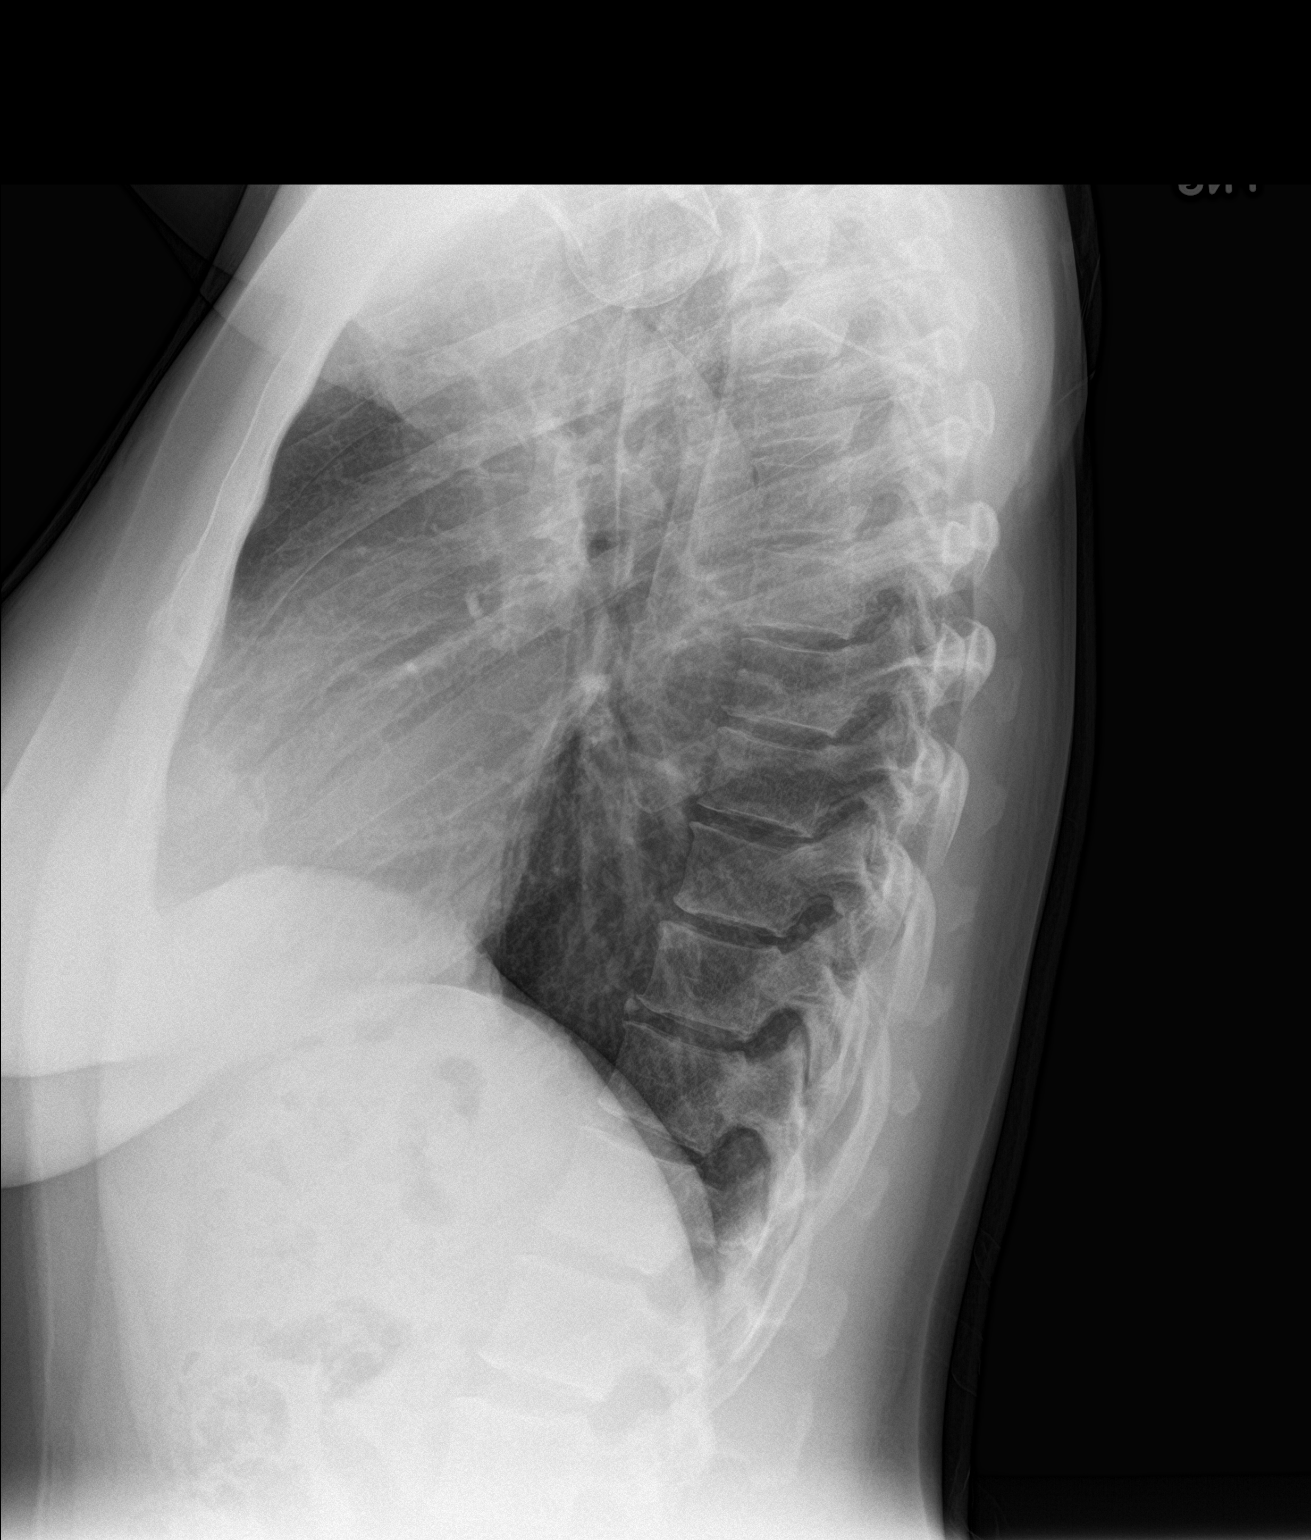

[2 of 2 positions shown; findings below may reference images not displayed]

FINDINGS: Lungs are clear. Heart size and pulmonary vascularity are normal. No
adenopathy. No bone lesions. No pneumothorax.
IMPRESSION: No edema or consolidation.  Cardiac silhouette within normal limits.
